# Patient Record
Sex: Female | Born: 1960 | Race: White | Hispanic: No | Marital: Single | Smoking: Current every day smoker
Health system: Southern US, Community
[De-identification: ages and names within clinical notes are randomized; demographics above are authoritative.]

## PROBLEM LIST (undated history)

## (undated) DIAGNOSIS — F419 Anxiety disorder, unspecified: Secondary | ICD-10-CM

## (undated) DIAGNOSIS — K219 Gastro-esophageal reflux disease without esophagitis: Secondary | ICD-10-CM

## (undated) DIAGNOSIS — A159 Respiratory tuberculosis unspecified: Secondary | ICD-10-CM

## (undated) DIAGNOSIS — D649 Anemia, unspecified: Secondary | ICD-10-CM

## (undated) DIAGNOSIS — C539 Malignant neoplasm of cervix uteri, unspecified: Secondary | ICD-10-CM

## (undated) DIAGNOSIS — J449 Chronic obstructive pulmonary disease, unspecified: Secondary | ICD-10-CM

## (undated) DIAGNOSIS — K5090 Crohn's disease, unspecified, without complications: Secondary | ICD-10-CM

## (undated) HISTORY — PX: ABDOMINAL HYSTERECTOMY: SHX81

## (undated) HISTORY — DX: Anxiety disorder, unspecified: F41.9

## (undated) HISTORY — DX: Respiratory tuberculosis unspecified: A15.9

## (undated) HISTORY — PX: COLON SURGERY: SHX602

## (undated) HISTORY — PX: DILATION AND CURETTAGE OF UTERUS: SHX78

---

## 2001-10-24 ENCOUNTER — Encounter: Payer: Self-pay | Admitting: Emergency Medicine

## 2001-10-24 ENCOUNTER — Emergency Department (HOSPITAL_COMMUNITY): Admission: EM | Admit: 2001-10-24 | Discharge: 2001-10-25 | Payer: Self-pay | Admitting: Emergency Medicine

## 2005-02-03 ENCOUNTER — Inpatient Hospital Stay: Payer: Self-pay | Admitting: Anesthesiology

## 2005-02-11 ENCOUNTER — Ambulatory Visit: Payer: Self-pay

## 2005-05-06 ENCOUNTER — Ambulatory Visit: Payer: Self-pay

## 2005-08-19 ENCOUNTER — Ambulatory Visit: Payer: Self-pay

## 2005-08-20 ENCOUNTER — Emergency Department: Payer: Self-pay | Admitting: General Practice

## 2005-08-21 ENCOUNTER — Emergency Department: Payer: Self-pay | Admitting: Internal Medicine

## 2005-08-25 ENCOUNTER — Ambulatory Visit: Payer: Self-pay | Admitting: Unknown Physician Specialty

## 2005-11-25 ENCOUNTER — Ambulatory Visit: Payer: Self-pay | Admitting: Unknown Physician Specialty

## 2006-01-18 ENCOUNTER — Ambulatory Visit: Payer: Self-pay | Admitting: Unknown Physician Specialty

## 2006-05-24 ENCOUNTER — Emergency Department: Payer: Self-pay | Admitting: General Practice

## 2006-05-31 ENCOUNTER — Inpatient Hospital Stay: Payer: Self-pay | Admitting: Internal Medicine

## 2006-06-13 ENCOUNTER — Inpatient Hospital Stay: Payer: Self-pay | Admitting: General Surgery

## 2006-10-13 ENCOUNTER — Ambulatory Visit: Payer: Self-pay | Admitting: Unknown Physician Specialty

## 2007-01-10 ENCOUNTER — Ambulatory Visit: Payer: Self-pay

## 2007-03-13 ENCOUNTER — Ambulatory Visit: Payer: Self-pay | Admitting: Unknown Physician Specialty

## 2007-06-14 ENCOUNTER — Emergency Department: Payer: Self-pay | Admitting: Emergency Medicine

## 2007-08-15 ENCOUNTER — Ambulatory Visit: Payer: Self-pay

## 2007-09-04 ENCOUNTER — Emergency Department: Payer: Self-pay

## 2007-09-05 ENCOUNTER — Ambulatory Visit: Payer: Self-pay | Admitting: Unknown Physician Specialty

## 2008-04-14 ENCOUNTER — Ambulatory Visit: Payer: Self-pay | Admitting: Unknown Physician Specialty

## 2009-04-28 ENCOUNTER — Ambulatory Visit: Payer: Self-pay | Admitting: Family Medicine

## 2009-05-05 ENCOUNTER — Ambulatory Visit: Payer: Self-pay | Admitting: Family Medicine

## 2009-11-15 ENCOUNTER — Emergency Department: Payer: Self-pay | Admitting: Unknown Physician Specialty

## 2010-04-24 ENCOUNTER — Emergency Department: Payer: Self-pay | Admitting: Emergency Medicine

## 2010-05-13 ENCOUNTER — Ambulatory Visit: Payer: Self-pay | Admitting: Family Medicine

## 2010-10-14 ENCOUNTER — Inpatient Hospital Stay: Payer: Self-pay | Admitting: Surgery

## 2011-03-06 ENCOUNTER — Emergency Department: Payer: Self-pay | Admitting: Emergency Medicine

## 2011-05-23 ENCOUNTER — Emergency Department: Payer: Self-pay | Admitting: Emergency Medicine

## 2011-06-28 ENCOUNTER — Emergency Department: Payer: Self-pay | Admitting: Emergency Medicine

## 2011-10-08 ENCOUNTER — Emergency Department: Payer: Self-pay | Admitting: *Deleted

## 2012-01-30 ENCOUNTER — Emergency Department: Payer: Self-pay | Admitting: *Deleted

## 2012-05-01 ENCOUNTER — Emergency Department: Payer: Self-pay | Admitting: Emergency Medicine

## 2012-05-20 ENCOUNTER — Emergency Department: Payer: Self-pay | Admitting: *Deleted

## 2012-05-20 LAB — URINALYSIS, COMPLETE
Bacteria: NONE SEEN
Bilirubin,UR: NEGATIVE
Blood: NEGATIVE
Glucose,UR: NEGATIVE mg/dL (ref 0–75)
Nitrite: NEGATIVE
Ph: 5 (ref 4.5–8.0)
Protein: NEGATIVE
RBC,UR: NONE SEEN /HPF (ref 0–5)
Specific Gravity: 1.026 (ref 1.003–1.030)
Squamous Epithelial: 3
WBC UR: 29 /HPF (ref 0–5)

## 2012-06-26 ENCOUNTER — Ambulatory Visit: Payer: Self-pay | Admitting: Family Medicine

## 2012-08-11 ENCOUNTER — Emergency Department: Payer: Self-pay | Admitting: Emergency Medicine

## 2012-08-12 ENCOUNTER — Emergency Department: Payer: Self-pay | Admitting: Emergency Medicine

## 2012-10-15 ENCOUNTER — Emergency Department: Payer: Self-pay | Admitting: Emergency Medicine

## 2013-01-01 ENCOUNTER — Ambulatory Visit: Payer: Self-pay | Admitting: Internal Medicine

## 2013-04-10 ENCOUNTER — Emergency Department: Payer: Self-pay | Admitting: Emergency Medicine

## 2013-05-09 ENCOUNTER — Ambulatory Visit: Payer: Self-pay | Admitting: Unknown Physician Specialty

## 2013-05-10 LAB — PATHOLOGY REPORT

## 2013-06-08 ENCOUNTER — Ambulatory Visit: Payer: Self-pay

## 2013-08-07 ENCOUNTER — Emergency Department: Payer: Self-pay | Admitting: Emergency Medicine

## 2013-09-04 ENCOUNTER — Ambulatory Visit: Payer: Self-pay | Admitting: Internal Medicine

## 2013-10-28 ENCOUNTER — Emergency Department: Payer: Self-pay | Admitting: Emergency Medicine

## 2013-10-28 LAB — CBC
HCT: 37.9 % (ref 35.0–47.0)
HGB: 13.2 g/dL (ref 12.0–16.0)
MCH: 33 pg (ref 26.0–34.0)
MCHC: 34.8 g/dL (ref 32.0–36.0)
MCV: 95 fL (ref 80–100)
Platelet: 148 10*3/uL — ABNORMAL LOW (ref 150–440)
RBC: 3.99 10*6/uL (ref 3.80–5.20)
RDW: 13 % (ref 11.5–14.5)
WBC: 11.5 10*3/uL — ABNORMAL HIGH (ref 3.6–11.0)

## 2013-10-28 LAB — COMPREHENSIVE METABOLIC PANEL
Albumin: 3.6 g/dL (ref 3.4–5.0)
Alkaline Phosphatase: 127 U/L (ref 50–136)
Anion Gap: 3 — ABNORMAL LOW (ref 7–16)
BUN: 12 mg/dL (ref 7–18)
Bilirubin,Total: 0.3 mg/dL (ref 0.2–1.0)
Calcium, Total: 8.8 mg/dL (ref 8.5–10.1)
Chloride: 106 mmol/L (ref 98–107)
Co2: 28 mmol/L (ref 21–32)
Creatinine: 0.88 mg/dL (ref 0.60–1.30)
EGFR (African American): >60
EGFR (Non-African Amer.): >60
Glucose: 116 mg/dL — ABNORMAL HIGH (ref 65–99)
Osmolality: 275 (ref 275–301)
Potassium: 3.8 mmol/L (ref 3.5–5.1)
SGOT(AST): 21 U/L (ref 15–37)
SGPT (ALT): 21 U/L (ref 12–78)
Sodium: 137 mmol/L (ref 136–145)
Total Protein: 7 g/dL (ref 6.4–8.2)

## 2013-10-28 LAB — LIPASE, BLOOD: Lipase: 516 U/L — ABNORMAL HIGH (ref 73–393)

## 2013-10-28 LAB — TROPONIN I: Troponin-I: <0.02 ng/mL

## 2013-11-01 ENCOUNTER — Ambulatory Visit: Payer: Self-pay | Admitting: Internal Medicine

## 2013-11-29 ENCOUNTER — Ambulatory Visit: Payer: Self-pay | Admitting: Otolaryngology

## 2014-04-17 DIAGNOSIS — K5080 Crohn's disease of both small and large intestine without complications: Secondary | ICD-10-CM | POA: Insufficient documentation

## 2014-09-01 ENCOUNTER — Ambulatory Visit: Payer: Self-pay | Admitting: Internal Medicine

## 2015-07-06 ENCOUNTER — Emergency Department
Admission: EM | Admit: 2015-07-06 | Discharge: 2015-07-06 | Disposition: A | Discharge status: Home / Self Care | Payer: Medicare Other | Attending: Emergency Medicine | Admitting: Emergency Medicine

## 2015-07-06 DIAGNOSIS — M62830 Muscle spasm of back: Secondary | ICD-10-CM | POA: Diagnosis not present

## 2015-07-06 DIAGNOSIS — Z720 Tobacco use: Secondary | ICD-10-CM | POA: Insufficient documentation

## 2015-07-06 DIAGNOSIS — M549 Dorsalgia, unspecified: Secondary | ICD-10-CM | POA: Diagnosis present

## 2015-07-06 HISTORY — DX: Crohn's disease, unspecified, without complications: K50.90

## 2015-07-06 LAB — URINALYSIS COMPLETE WITH MICROSCOPIC (ARMC ONLY)
Bacteria, UA: NONE SEEN
Bilirubin Urine: NEGATIVE
GLUCOSE, UA: NEGATIVE mg/dL
HGB URINE DIPSTICK: NEGATIVE
Ketones, ur: NEGATIVE mg/dL
LEUKOCYTES UA: NEGATIVE
Nitrite: NEGATIVE
PROTEIN: 30 mg/dL — AB
RBC / HPF: NONE SEEN RBC/hpf (ref 0–5)
SPECIFIC GRAVITY, URINE: 1.028 (ref 1.005–1.030)
pH: 6 (ref 5.0–8.0)

## 2015-07-06 LAB — BASIC METABOLIC PANEL
ANION GAP: 8 (ref 5–15)
BUN: 19 mg/dL (ref 6–20)
CALCIUM: 9.4 mg/dL (ref 8.9–10.3)
CHLORIDE: 106 mmol/L (ref 101–111)
CO2: 29 mmol/L (ref 22–32)
Creatinine, Ser: 0.82 mg/dL (ref 0.44–1.00)
GFR calc non Af Amer: >60 mL/min (ref >60)
Glucose, Bld: 101 mg/dL — ABNORMAL HIGH (ref 65–99)
POTASSIUM: 3.7 mmol/L (ref 3.5–5.1)
SODIUM: 143 mmol/L (ref 135–145)

## 2015-07-06 LAB — CBC
HEMATOCRIT: 40.3 % (ref 35.0–47.0)
Hemoglobin: 13.6 g/dL (ref 12.0–16.0)
MCH: 32.7 pg (ref 26.0–34.0)
MCHC: 33.6 g/dL (ref 32.0–36.0)
MCV: 97.2 fL (ref 80.0–100.0)
PLATELETS: 165 10*3/uL (ref 150–440)
RBC: 4.15 MIL/uL (ref 3.80–5.20)
RDW: 13.3 % (ref 11.5–14.5)
WBC: 8.9 10*3/uL (ref 3.6–11.0)

## 2015-07-06 MED ORDER — CYCLOBENZAPRINE HCL 10 MG PO TABS: 10.0000 mg | ORAL_TABLET | Freq: Three times a day (TID) | ORAL | Status: AC | PRN

## 2015-07-06 MED ORDER — TRAMADOL HCL 50 MG PO TABS: 50.0000 mg | ORAL_TABLET | Freq: Four times a day (QID) | ORAL | Status: AC | PRN

## 2015-07-06 NOTE — ED Notes (Signed)
Pt c/o left flank pain for the past 4 days, worse when she lays down at night..denies urinary sx or N/V.Marland Kitchen

## 2015-07-06 NOTE — Discharge Instructions (Signed)

## 2015-07-06 NOTE — ED Provider Notes (Signed)
[facility] Emergency Department Provider Note  ____________________________________________  Time seen: On arrival  I have reviewed the triage vital signs and the nursing notes.   HISTORY  Chief Complaint Flank Pain    HPI Rebecca Escobar is a 54 y.o. female who complains of mild left back pain for the last 2-3 days. It seems to be worse when she lies down up as pressure-like. It also hurts when she twists her back or flexes forward. She is concerned it may be related to her kidney. No fevers no chills. No IV drug abuse. No focal deficits. No rash, no tingling.    Past Medical History  Diagnosis Date  . Crohn's disease     There are no active problems to display for this patient.   Past Surgical History  Procedure Laterality Date  . Cesarean section    . Colon surgery    . Abdominal hysterectomy      Current Outpatient Rx  Name  Route  Sig  Dispense  Refill  . cyclobenzaprine (FLEXERIL) 10 MG tablet   Oral   Take 1 tablet (10 mg total) by mouth every 8 (eight) hours as needed for muscle spasms.   30 tablet   1   . traMADol (ULTRAM) 50 MG tablet   Oral   Take 1 tablet (50 mg total) by mouth every 6 (six) hours as needed.   20 tablet   0     Allergies Review of patient's allergies indicates no known allergies.  No family history on file.  Social History History  Substance Use Topics  . Smoking status: Current Every Day Smoker -- 0.50 packs/day    Types: Cigarettes  . Smokeless tobacco: Never Used  . Alcohol Use: No    Review of Systems  Constitutional: Negative for fever. Eyes: Negative for visual changes. ENT: Negative for sore throat   Genitourinary: Negative for dysuria. Musculoskeletal: Negative for back pain. Skin: Negative for rash. Neurological: Negative for headaches or focal weakness   ____________________________________________   PHYSICAL EXAM:  VITAL SIGNS: ED Triage Vitals  Enc Vitals Group      BP 07/06/15 1538 108/67 mmHg     Pulse Rate 07/06/15 1538 58     Resp 07/06/15 1538 16     Temp --      Temp Source 07/06/15 1538 Oral     SpO2 07/06/15 1538 100 %     Weight 07/06/15 1538 170 lb (77.111 kg)     Height 07/06/15 1538 5' 6"  (1.676 m)     Head Cir --      Peak Flow --      Pain Score 07/06/15 1538 8     Pain Loc --      Pain Edu? --      Excl. in [place]? --      Constitutional: Alert and oriented. Well appearing and in no distress. Eyes: Conjunctivae are normal.  ENT   Head: Normocephalic and atraumatic.   Mouth/Throat: Mucous membranes are moist. Cardiovascular: Normal rate, regular rhythm.  Respiratory: Normal respiratory effort without tachypnea nor retractions.  Gastrointestinal: Soft and non-tender in all quadrants. No distention. There is no CVA tenderness. Musculoskeletal: Nontender with normal range of motion in all extremities. Patient has muscle spasm to left lower paraspinal muscle. She is tender only in that spot Neurologic:  Normal speech and language. No gross focal neurologic deficits are appreciated. Normal lower extremity strength Skin:  Skin is warm, dry and intact. No rash  noted. Psychiatric: Mood and affect are normal. Patient exhibits appropriate insight and judgment.  ____________________________________________    LABS (pertinent positives/negatives)  Labs Reviewed  BASIC METABOLIC PANEL - Abnormal; Notable for the following:    Glucose, Bld 101 (*)    All other components within normal limits  URINALYSIS COMPLETEWITH MICROSCOPIC ([facility] ONLY) - Abnormal; Notable for the following:    Color, Urine YELLOW (*)    APPearance CLEAR (*)    Protein, ur 30 (*)    Squamous Epithelial / LPF 0-5 (*)    All other components within normal limits  CBC    ____________________________________________     ____________________________________________    RADIOLOGY I have personally reviewed any xrays that were ordered on this  patient:   ____________________________________________   PROCEDURES  Procedure(s) performed: none   ____________________________________________   INITIAL IMPRESSION / ASSESSMENT AND PLAN / ED COURSE  Pertinent labs & imaging results that were available during my care of the patient were reviewed by me and considered in my medical decision making (see chart for details). Benign exam most consistent with muscle spasm. We will treat conservatively. Follow-up with PCP if no improvement in 2 days  ____________________________________________   FINAL CLINICAL IMPRESSION(S) / ED DIAGNOSES  Final diagnoses:  Muscle spasm of back     Lavonia Drafts, MD 07/06/15 1918

## 2016-03-01 ENCOUNTER — Encounter: Payer: Self-pay | Admitting: *Deleted

## 2016-03-02 ENCOUNTER — Encounter
Admission: RE | Disposition: A | Discharge status: Home / Self Care | Payer: Self-pay | Source: Ambulatory Visit | Attending: Unknown Physician Specialty

## 2016-03-02 ENCOUNTER — Ambulatory Visit: Payer: Medicare Other | Admitting: Anesthesiology

## 2016-03-02 ENCOUNTER — Ambulatory Visit
Admission: RE | Admit: 2016-03-02 | Discharge: 2016-03-02 | Disposition: A | Discharge status: Home / Self Care | Payer: Medicare Other | Source: Ambulatory Visit | Attending: Unknown Physician Specialty | Admitting: Unknown Physician Specialty

## 2016-03-02 ENCOUNTER — Encounter: Payer: Self-pay | Admitting: Anesthesiology

## 2016-03-02 DIAGNOSIS — K219 Gastro-esophageal reflux disease without esophagitis: Secondary | ICD-10-CM | POA: Diagnosis not present

## 2016-03-02 DIAGNOSIS — D125 Benign neoplasm of sigmoid colon: Secondary | ICD-10-CM | POA: Diagnosis not present

## 2016-03-02 DIAGNOSIS — Z79899 Other long term (current) drug therapy: Secondary | ICD-10-CM | POA: Insufficient documentation

## 2016-03-02 DIAGNOSIS — F17210 Nicotine dependence, cigarettes, uncomplicated: Secondary | ICD-10-CM | POA: Insufficient documentation

## 2016-03-02 DIAGNOSIS — K640 First degree hemorrhoids: Secondary | ICD-10-CM | POA: Diagnosis not present

## 2016-03-02 DIAGNOSIS — K5090 Crohn's disease, unspecified, without complications: Secondary | ICD-10-CM | POA: Insufficient documentation

## 2016-03-02 DIAGNOSIS — Z8541 Personal history of malignant neoplasm of cervix uteri: Secondary | ICD-10-CM | POA: Diagnosis not present

## 2016-03-02 HISTORY — PX: COLONOSCOPY WITH PROPOFOL: SHX5780

## 2016-03-02 HISTORY — DX: Gastro-esophageal reflux disease without esophagitis: K21.9

## 2016-03-02 HISTORY — DX: Anemia, unspecified: D64.9

## 2016-03-02 HISTORY — DX: Malignant neoplasm of cervix uteri, unspecified: C53.9

## 2016-03-02 SURGERY — COLONOSCOPY WITH PROPOFOL
Anesthesia: General

## 2016-03-02 MED ORDER — PROPOFOL 500 MG/50ML IV EMUL
INTRAVENOUS | Status: DC | PRN
  Administered 2016-03-02: 120 ug/kg/min via INTRAVENOUS

## 2016-03-02 MED ORDER — SODIUM CHLORIDE 0.9 % IV SOLN
INTRAVENOUS | Status: DC
  Administered 2016-03-02: 1000 mL via INTRAVENOUS

## 2016-03-02 MED ORDER — PROPOFOL 10 MG/ML IV BOLUS
INTRAVENOUS | Status: DC | PRN
  Administered 2016-03-02: 80 mg via INTRAVENOUS

## 2016-03-02 MED ORDER — MIDAZOLAM HCL 2 MG/2ML IJ SOLN
INTRAMUSCULAR | Status: DC | PRN
  Administered 2016-03-02: 1 mg via INTRAVENOUS

## 2016-03-02 MED ORDER — SODIUM CHLORIDE 0.9 % IV SOLN: INTRAVENOUS | Status: DC

## 2016-03-02 NOTE — Op Note (Signed)
[facility]astroenterology Patient Name: Rebecca Escobar Procedure Date: 03/02/2016 10:16 AM MRN: [id number] Account #: [id number] Date of Birth: 07-13-1961 Admit Type: Outpatient Age: 56 Room: [facility] ENDO ROOM 4 Gender: Female Note Status: Finalized Procedure:            Colonoscopy Indications:          High risk colon cancer surveillance: Crohn's small and                        large intestine Providers:            Manya Silvas, MD Referring MD:         Ellin Goodie, MD (Referring MD) Medicines:            Propofol per Anesthesia Complications:        No immediate complications. Procedure:            Pre-Anesthesia Assessment:                       - After reviewing the risks and benefits, the patient                        was deemed in satisfactory condition to undergo the                        procedure.                       After obtaining informed consent, the colonoscope was                        passed under direct vision. Throughout the procedure,                        the patient's blood pressure, pulse, and oxygen                        saturations were monitored continuously. The                        Colonoscope was introduced through the anus and                        advanced to the the cecum, identified by appendiceal                        orifice and ileocecal valve. The colonoscopy was                        performed without difficulty. The patient tolerated the                        procedure well. The quality of the bowel preparation                        was good. Findings:      Multiple sessile polyps were found in the sigmoid colon. The polyps were       small in size. These polyps were removed with a hot snare. Resection and       retrieval were complete.      Internal hemorrhoids were found  during endoscopy. The hemorrhoids were       medium-sized and Grade I (internal hemorrhoids that do not prolapse).  Anastamotic junction was very narrowed and scope would not even come       close to passing into the small bowel. Impression:           - Multiple small polyps in the sigmoid colon, removed                        with a hot snare. Resected and retrieved.                       - Internal hemorrhoids. Recommendation:       - Await pathology results. Manya Silvas, MD 03/02/2016 10:54:44 AM This report has been signed electronically. Number of Addenda: 0 Note Initiated On: 03/02/2016 10:16 AM Scope Withdrawal Time: 0 hours 21 minutes 26 seconds  Total Procedure Duration: 0 hours 28 minutes 15 seconds       [facility]

## 2016-03-02 NOTE — H&P (Signed)
   Primary Care Physician:  Ellamae Sia, MD Primary Gastroenterologist:  Dr. Vira Agar  Pre-Procedure History & Physical: HPI:  Rebecca Escobar is a 55 y.o. female is here for an colonoscopy.   Past Medical History  Diagnosis Date  . Crohn's disease ([place])   . Anemia   . GERD (gastroesophageal reflux disease)   . Cervical cancer [facility])     Past Surgical History  Procedure Laterality Date  . Cesarean section    . Abdominal hysterectomy    . Colon surgery      Prior to Admission medications   Medication Sig Start Date End Date Taking? Authorizing Provider  Certolizumab Pegol 2 X 200 MG/ML KIT Inject 400 mg into the skin every 30 (thirty) days.   Yes Historical Provider, MD  cyanocobalamin 1000 MCG tablet Inject 1,000 mcg as directed every 30 (thirty) days.   Yes Historical Provider, MD  meclizine (ANTIVERT) 12.5 MG tablet Take 12.5 mg by mouth 3 (three) times daily as needed for dizziness.   Yes Historical Provider, MD  Multiple Vitamin (MULTIVITAMIN) tablet Take 1 tablet by mouth daily.   Yes Historical Provider, MD  omeprazole (PRILOSEC) 20 MG capsule Take 20 mg by mouth daily.   Yes Historical Provider, MD  cyclobenzaprine (FLEXERIL) 10 MG tablet Take 1 tablet (10 mg total) by mouth every 8 (eight) hours as needed for muscle spasms. 07/06/15 07/05/16  Lavonia Drafts, MD  traMADol (ULTRAM) 50 MG tablet Take 1 tablet (50 mg total) by mouth every 6 (six) hours as needed. 07/06/15 07/05/16  Lavonia Drafts, MD    Allergies as of 02/18/2016  . (No Known Allergies)    History reviewed. No pertinent family history.  Social History   Social History  . Marital Status: Divorced    Spouse Name: N/A  . Number of Children: N/A  . Years of Education: N/A   Occupational History  . Not on file.   Social History Main Topics  . Smoking status: Current Every Day Smoker -- 0.50 packs/day    Types: Cigarettes  . Smokeless tobacco: Never Used  . Alcohol Use: No  . Drug Use: No  .  Sexual Activity: Yes   Other Topics Concern  . Not on file   Social History Narrative    Review of Systems: See HPI, otherwise negative ROS  Physical Exam: BP 130/64 mmHg  Pulse 63  Temp(Src) 96.5 F (35.8 C) (Tympanic)  Resp 18  Ht _0  (1.702 m)  Wt 80.287 kg (177 lb)  BMI 27.72 kg/m2  SpO2 100% General:   Alert,  pleasant and cooperative in NAD Head:  Normocephalic and atraumatic. Neck:  Supple; no masses or thyromegaly. Lungs:  Clear throughout to auscultation.    Heart:  Regular rate and rhythm. Abdomen:  Soft, nontender and nondistended. Normal bowel sounds, without guarding, and without rebound.   Neurologic:  Alert and  oriented x4;  grossly normal neurologically.  Impression/Plan: Rebecca Escobar is here for an colonoscopy to be performed for follow up Crohn's disease.  Risks, benefits, limitations, and alternatives regarding  colonoscopy have been reviewed with the patient.  Questions have been answered.  All parties agreeable.   Gaylyn Cheers, MD  03/02/2016, 10:08 AM

## 2016-03-02 NOTE — Anesthesia Preprocedure Evaluation (Addendum)
Anesthesia Evaluation  Patient identified by MRN, date of birth, ID band Patient awake    Reviewed: Allergy & Precautions, NPO status , Patient's Chart, lab work & pertinent test results  Airway Mallampati: II  TM Distance: >3 FB     Dental  (+) Partial Lower, Chipped   Pulmonary Current Smoker,   hacky cough at times secondary to smoking Pulmonary exam normal        Cardiovascular negative cardio ROS Normal cardiovascular exam     Neuro/Psych negative neurological ROS  negative psych ROS   GI/Hepatic Neg liver ROS, GERD  Medicated and Controlled,crohns Dz   Endo/Other  negative endocrine ROS  Renal/GU negative Renal ROS   Cervical CA 2001    Musculoskeletal negative musculoskeletal ROS (+)   Abdominal Normal abdominal exam  (+)   Peds negative pediatric ROS (+)  Hematology  (+) anemia ,   Anesthesia Other Findings   Reproductive/Obstetrics                            Anesthesia Physical Anesthesia Plan  ASA: II  Anesthesia Plan: General   Post-op Pain Management:    Induction: Intravenous  Airway Management Planned: Nasal Cannula  Additional Equipment:   Intra-op Plan:   Post-operative Plan:   Informed Consent: I have reviewed the patients History and Physical, chart, labs and discussed the procedure including the risks, benefits and alternatives for the proposed anesthesia with the patient or authorized representative who has indicated his/her understanding and acceptance.   Dental advisory given  Plan Discussed with: CRNA and Surgeon  Anesthesia Plan Comments:         Anesthesia Quick Evaluation

## 2016-03-02 NOTE — Anesthesia Postprocedure Evaluation (Signed)
Anesthesia Post Note  Patient: Rebecca Escobar  Procedure(s) Performed: Procedure(s) (LRB): COLONOSCOPY WITH PROPOFOL (N/A)  Patient location during evaluation: PACU Anesthesia Type: General Level of consciousness: awake and alert and oriented Pain management: pain level controlled Vital Signs Assessment: post-procedure vital signs reviewed and stable Respiratory status: spontaneous breathing Cardiovascular status: blood pressure returned to baseline Anesthetic complications: no    Last Vitals:  Filed Vitals:   03/02/16 1125 03/02/16 1135  BP: 119/74 132/76  Pulse: 60 65  Temp:    Resp: 18 21    Last Pain: There were no vitals filed for this visit.               Rayne Loiseau

## 2016-03-02 NOTE — Transfer of Care (Signed)
Immediate Anesthesia Transfer of Care Note  Patient: Rebecca Escobar  Procedure(s) Performed: Procedure(s): COLONOSCOPY WITH PROPOFOL (N/A)  Patient Location: PACU and Endoscopy Unit  Anesthesia Type:General  Level of Consciousness: patient cooperative and lethargic  Airway & Oxygen Therapy: Patient Spontanous Breathing and Patient connected to nasal cannula oxygen  Post-op Assessment: Report given to RN and Post -op Vital signs reviewed and stable  Post vital signs: Reviewed and stable  Last Vitals:  Filed Vitals:   03/02/16 0956 03/02/16 1057  BP: 130/64 99/66  Pulse: 63   Temp: 35.8 C 36 C  Resp: 18 22    Complications: No apparent anesthesia complications

## 2016-03-03 LAB — SURGICAL PATHOLOGY

## 2016-04-29 ENCOUNTER — Emergency Department
Admission: EM | Admit: 2016-04-29 | Discharge: 2016-04-29 | Disposition: A | Discharge status: Home / Self Care | Payer: Medicare Other | Attending: Emergency Medicine | Admitting: Emergency Medicine

## 2016-04-29 ENCOUNTER — Encounter: Payer: Self-pay | Admitting: Medical Oncology

## 2016-04-29 ENCOUNTER — Emergency Department: Payer: Medicare Other

## 2016-04-29 DIAGNOSIS — J029 Acute pharyngitis, unspecified: Secondary | ICD-10-CM | POA: Diagnosis present

## 2016-04-29 DIAGNOSIS — F17210 Nicotine dependence, cigarettes, uncomplicated: Secondary | ICD-10-CM | POA: Diagnosis not present

## 2016-04-29 DIAGNOSIS — J36 Peritonsillar abscess: Secondary | ICD-10-CM | POA: Insufficient documentation

## 2016-04-29 DIAGNOSIS — Z8541 Personal history of malignant neoplasm of cervix uteri: Secondary | ICD-10-CM | POA: Diagnosis not present

## 2016-04-29 LAB — COMPREHENSIVE METABOLIC PANEL
ALBUMIN: 3.8 g/dL (ref 3.5–5.0)
ALK PHOS: 102 U/L (ref 38–126)
ALT: 9 U/L — AB (ref 14–54)
AST: 16 U/L (ref 15–41)
Anion gap: 8 (ref 5–15)
BUN: 12 mg/dL (ref 6–20)
CALCIUM: 9.1 mg/dL (ref 8.9–10.3)
CO2: 27 mmol/L (ref 22–32)
CREATININE: 0.7 mg/dL (ref 0.44–1.00)
Chloride: 105 mmol/L (ref 101–111)
GFR calc Af Amer: >60 mL/min (ref >60)
GFR calc non Af Amer: >60 mL/min (ref >60)
GLUCOSE: 96 mg/dL (ref 65–99)
Potassium: 4.2 mmol/L (ref 3.5–5.1)
SODIUM: 140 mmol/L (ref 135–145)
Total Bilirubin: 0.6 mg/dL (ref 0.3–1.2)
Total Protein: 7.5 g/dL (ref 6.5–8.1)

## 2016-04-29 LAB — CBC WITH DIFFERENTIAL/PLATELET
Basophils Absolute: 0.1 10*3/uL (ref 0–0.1)
EOS ABS: 0 10*3/uL (ref 0–0.7)
Eosinophils Relative: 0 %
HCT: 38 % (ref 35.0–47.0)
HEMOGLOBIN: 13 g/dL (ref 12.0–16.0)
LYMPHS ABS: 1.7 10*3/uL (ref 1.0–3.6)
Lymphocytes Relative: 13 %
MCH: 32.7 pg (ref 26.0–34.0)
MCHC: 34.3 g/dL (ref 32.0–36.0)
MCV: 95.3 fL (ref 80.0–100.0)
Monocytes Absolute: 0.6 10*3/uL (ref 0.2–0.9)
Monocytes Relative: 4 %
NEUTROS ABS: 10.7 10*3/uL — AB (ref 1.4–6.5)
Platelets: 197 10*3/uL (ref 150–440)
RBC: 3.99 MIL/uL (ref 3.80–5.20)
RDW: 13.2 % (ref 11.5–14.5)
WBC: 13.1 10*3/uL — AB (ref 3.6–11.0)

## 2016-04-29 MED ORDER — IOPAMIDOL (ISOVUE-300) INJECTION 61%
75.0000 mL | Freq: Once | INTRAVENOUS | Status: AC | PRN
Start: 2016-04-29 — End: 2016-04-29
  Administered 2016-04-29: 75 mL via INTRAVENOUS
  Filled 2016-04-29: qty 75

## 2016-04-29 MED ORDER — DEXAMETHASONE SODIUM PHOSPHATE 10 MG/ML IJ SOLN
10.0000 mg | Freq: Once | INTRAMUSCULAR | Status: AC
  Administered 2016-04-29: 10 mg via INTRAVENOUS
  Filled 2016-04-29: qty 1

## 2016-04-29 MED ORDER — MORPHINE SULFATE (PF) 4 MG/ML IV SOLN
4.0000 mg | Freq: Once | INTRAVENOUS | Status: AC
  Administered 2016-04-29: 4 mg via INTRAVENOUS
  Filled 2016-04-29: qty 1

## 2016-04-29 MED ORDER — ONDANSETRON HCL 4 MG/2ML IJ SOLN
4.0000 mg | Freq: Once | INTRAMUSCULAR | Status: AC
  Administered 2016-04-29: 4 mg via INTRAVENOUS
  Filled 2016-04-29: qty 2

## 2016-04-29 MED ORDER — HYDROCODONE-ACETAMINOPHEN 5-325 MG PO TABS: 1.0000 | ORAL_TABLET | ORAL | Status: DC | PRN

## 2016-04-29 MED ORDER — HYDROMORPHONE HCL 1 MG/ML IJ SOLN
0.5000 mg | Freq: Once | INTRAMUSCULAR | Status: AC
  Administered 2016-04-29: 0.5 mg via INTRAVENOUS
  Filled 2016-04-29: qty 1

## 2016-04-29 MED ORDER — PREDNISONE 10 MG (21) PO TBPK: ORAL_TABLET | ORAL | Status: DC

## 2016-04-29 MED ORDER — SODIUM CHLORIDE 0.9 % IV SOLN
3.0000 g | Freq: Once | INTRAVENOUS | Status: AC
  Administered 2016-04-29: 3 g via INTRAVENOUS
  Filled 2016-04-29: qty 3

## 2016-04-29 MED ORDER — AMOXICILLIN-POT CLAVULANATE 875-125 MG PO TABS: 1.0000 | ORAL_TABLET | Freq: Two times a day (BID) | ORAL | Status: DC

## 2016-04-29 NOTE — ED Notes (Signed)
CT notified that scan had not yet been completed.  CT unsure when they will come get patient to do scan.

## 2016-04-29 NOTE — ED Notes (Signed)
Patient states today is day 10 of sxs of throat pain and right ear and right head pain.  Patient states she has been seen by her PCP and did a strep test and culture which where negative and looked in her ears and have not been able to find anything wrong.  Patient c/o difficulty sleeping due to sxs and painful when swallowing. Pt states she did take a Tramadol today that is prescribed to her.

## 2016-04-29 NOTE — ED Notes (Signed)
Pt reports sore throat, rt ear ache and rt sided headache x 10 days.

## 2016-04-29 NOTE — Discharge Instructions (Signed)
Peritonsillar Abscess A peritonsillar abscess is a collection of yellowish-white fluid (pus) in the back of the throat behind the tonsils. It usually occurs when an infection of the throat or tonsils (tonsillitis) spreads into the tissues around the tonsils. CAUSES The infection that leads to a peritonsillar abscess is usually caused by streptococcal bacteria.  SIGNS AND SYMPTOMS  Sore throat, often with pain on just one side.  Swelling and tenderness of the glands (lymph nodes) in the neck.  Difficulty swallowing.  Difficulty opening your mouth.  Fever.  Chills.  Drooling because of difficulty swallowing saliva.  Headache.  Changes in your voice.  Bad breath. DIAGNOSIS Your health care provider will take your medical history and do a physical exam. Imaging tests may be done, such as an ultrasound or CT scan. A sample of pus may be removed from the abscess using a needle (needle aspiration) or by swabbing the back of your throat. This sample will be sent to a lab for testing. TREATMENT Treatment usually involves draining the pus from the abscess. This may be done through needle aspiration or by making an incision in the abscess. You will also likely need to take antibiotic medicine. HOME CARE INSTRUCTIONS  Rest as much as possible and get plenty of sleep.  Take medicines only as directed by your health care provider.  If you were prescribed an antibiotic medicine, finish it all even if you start to feel better.  If your abscess was drained by your health care provider, gargle with a mixture of salt and warm water:  Mix 1 tsp of salt in 8 oz of warm water.  Gargle with this mixture four times per day or as needed for comfort.  Do not swallow this mixture.  Drink plenty of fluids.  While your throat is sore, eat soft or liquid foods, such as frozen ice pops and ice cream.  Keep all follow-up visits as directed by your health care provider. This is important. SEEK  MEDICAL CARE IF:  You have increased pain, swelling, redness, or drainage in your throat.  You develop a headache, a lack of energy (lethargy), or generalized feelings of illness.  You have a fever.  You feel dizzy.  You have difficulty swallowing or eating.  You show signs of becoming dehydrated, such as:  Light-headedness when standing.  Decreased urine output.  A fast heart rate.  Dry mouth. SEEK IMMEDIATE MEDICAL CARE IF:   You have difficulty talking or breathing, or you find it easier to breathe when you lean forward.  You are coughing up blood or vomiting blood.  You have severe throat pain that is not helped by medicines.  You start to drool.   This information is not intended to replace advice given to you by your health care provider. Make sure you discuss any questions you have with your health care provider.   Document Released: 12/05/2005 Document Revised: 12/26/2014 Document Reviewed: 07/21/2014 Elsevier Interactive Patient Education [employer].

## 2016-04-29 NOTE — ED Notes (Signed)
Patient transported to CT 

## 2016-04-29 NOTE — ED Provider Notes (Signed)
[facility] Emergency Department Provider Note  ____________________________________________  Time seen: Approximately 1:17 PM  I have reviewed the triage vital signs and the nursing notes.   HISTORY  Chief Complaint Sore Throat; Otalgia; and Headache   HPI Rebecca Escobar is a 55 y.o. female who presents to the emergency department for evaluation of sore throat and right ear pain x 7 days. She has been evaluated by her PCP twice, but has tested negative for strep and an ear infection. She has not been on any antibiotics. She states that the right side of her throat and her right ear are very painful and getting worse each day. Swallowing is painful and becoming more difficulty. Pain is preventing sleep. Some relief with tramadol today, otherwise she has just been using the nasal spray her PCP prescribed.    Past Medical History  Diagnosis Date  . Crohn's disease ([place])   . Anemia   . GERD (gastroesophageal reflux disease)   . Cervical cancer ([place])     There are no active problems to display for this patient.   Past Surgical History  Procedure Laterality Date  . Cesarean section    . Abdominal hysterectomy    . Colon surgery    . Colonoscopy with propofol N/A 03/02/2016    Procedure: COLONOSCOPY WITH PROPOFOL;  Surgeon: Manya Silvas, MD;  Location: [facility] ENDOSCOPY;  Service: Endoscopy;  Laterality: N/A;    Current Outpatient Rx  Name  Route  Sig  Dispense  Refill  . amoxicillin-clavulanate (AUGMENTIN) 875-125 MG tablet   Oral   Take 1 tablet by mouth 2 (two) times daily.   20 tablet   0   . Certolizumab Pegol 2 X 200 MG/ML KIT   Subcutaneous   Inject 400 mg into the skin every 30 (thirty) days.         . cyanocobalamin 1000 MCG tablet   Injection   Inject 1,000 mcg as directed every 30 (thirty) days.         . cyclobenzaprine (FLEXERIL) 10 MG tablet   Oral   Take 1 tablet (10 mg total) by mouth every 8 (eight) hours as needed for  muscle spasms.   30 tablet   1   . HYDROcodone-acetaminophen (NORCO/VICODIN) 5-325 MG tablet   Oral   Take 1 tablet by mouth every 4 (four) hours as needed.   12 tablet   0   . meclizine (ANTIVERT) 12.5 MG tablet   Oral   Take 12.5 mg by mouth 3 (three) times daily as needed for dizziness.         . Multiple Vitamin (MULTIVITAMIN) tablet   Oral   Take 1 tablet by mouth daily.         Marland Kitchen omeprazole (PRILOSEC) 20 MG capsule   Oral   Take 20 mg by mouth daily.         . predniSONE (STERAPRED UNI-PAK 21 TAB) 10 MG (21) TBPK tablet      Take 6 tablets on day 1 Take 5 tablets on day 2 Take 4 tablets on day 3 Take 3 tablets on day 4 Take 2 tablets on day 5 Take 1 tablet on day 6   21 tablet   0   . traMADol (ULTRAM) 50 MG tablet   Oral   Take 1 tablet (50 mg total) by mouth every 6 (six) hours as needed.   20 tablet   0     Allergies Nsaids  No family  history on file.  Social History Social History  Substance Use Topics  . Smoking status: Current Every Day Smoker -- 0.50 packs/day    Types: Cigarettes  . Smokeless tobacco: Never Used  . Alcohol Use: No    Review of Systems Constitutional: Negative for fever. Eyes: No visual changes. ENT: Positive for sore throat; positive for difficulty swallowing. Respiratory: Denies shortness of breath. Gastrointestinal: No abdominal pain.  No nausea, no vomiting.  No diarrhea. Musculoskeletal: Negative for generalized body aches. Skin: Negative for rash. Neurological: Negative for headaches, focal weakness or numbness.  ____________________________________________   PHYSICAL EXAM:  VITAL SIGNS: ED Triage Vitals  Enc Vitals Group     BP 04/29/16 1236 143/82 mmHg     Pulse Rate 04/29/16 1236 80     Resp 04/29/16 1236 20     Temp 04/29/16 1236 97.6 F (36.4 C)     Temp Source 04/29/16 1236 Oral     SpO2 04/29/16 1236 97 %     Weight 04/29/16 1236 172 lb (78.019 kg)     Height 04/29/16 1236 5' 6"  (1.676 m)      Head Cir --      Peak Flow --      Pain Score 04/29/16 1237 10     Pain Loc --      Pain Edu? --      Excl. in [place]? --     Constitutional: Alert and oriented. Well appearing and in no acute distress. Eyes: Conjunctivae are normal. PERRL. EOMI. Head: Atraumatic. Nose: No congestion/rhinnorhea. Mouth/Throat: Mucous membranes are moist.  Oropharynx erythematous, with right side tonsil edema and swelling. Neck: No stridor. Managing secretions. Lymphatic: Lymphadenopathy: right anterior cervical palpable and tender Cardiovascular: Normal rate, regular rhythm. Good peripheral circulation. Respiratory: Normal respiratory effort. Lungs CTAB. Gastrointestinal: Soft and nontender. Musculoskeletal: No lower extremity tenderness nor edema.   Neurologic:  Normal speech and language. No gross focal neurologic deficits are appreciated. Speech is normal. No gait instability. Skin:  Skin is warm, dry and intact. No rash noted Psychiatric: Mood and affect are normal. Speech and behavior are normal.  ____________________________________________   LABS (all labs ordered are listed, but only abnormal results are displayed)  Labs Reviewed  CBC WITH DIFFERENTIAL/PLATELET - Abnormal; Notable for the following:    WBC 13.1 (*)    Neutro Abs 10.7 (*)    All other components within normal limits  COMPREHENSIVE METABOLIC PANEL - Abnormal; Notable for the following:    ALT 9 (*)    All other components within normal limits  CULTURE, BLOOD (ROUTINE X 2)  CULTURE, BLOOD (ROUTINE X 2)   ____________________________________________  EKG   ____________________________________________  RADIOLOGY  1.5 cm tonsillar/peritonsillar abscess on the right per radiology. ____________________________________________   PROCEDURES  Procedure(s) performed: None  Critical Care performed: No  ____________________________________________   INITIAL IMPRESSION / ASSESSMENT AND PLAN / ED  COURSE  Pertinent labs & imaging results that were available during my care of the patient were reviewed by me and considered in my medical decision making (see chart for details).  After speaking with Dr. Richardson Landry, patient will be discharged home on Augmentin and prednisone. She will also receive Norco for pain control. Strict return precautions were given and patient verbalized understanding. ____________________________________________   FINAL CLINICAL IMPRESSION(S) / ED DIAGNOSES  Final diagnoses:  Peritonsillar abscess      Victorino Dike, FNP 04/29/16 [place], MD 05/01/16 [phone]

## 2016-05-05 LAB — CULTURE, BLOOD (ROUTINE X 2)
CULTURE: NO GROWTH
CULTURE: NO GROWTH

## 2016-07-19 ENCOUNTER — Ambulatory Visit: Payer: Self-pay | Admitting: Urology

## 2016-07-31 ENCOUNTER — Emergency Department
Admission: EM | Admit: 2016-07-31 | Discharge: 2016-07-31 | Disposition: A | Discharge status: Home / Self Care | Payer: Medicare Other | Attending: Emergency Medicine | Admitting: Emergency Medicine

## 2016-07-31 ENCOUNTER — Encounter: Payer: Self-pay | Admitting: Intensive Care

## 2016-07-31 DIAGNOSIS — F17210 Nicotine dependence, cigarettes, uncomplicated: Secondary | ICD-10-CM | POA: Diagnosis not present

## 2016-07-31 DIAGNOSIS — Z8541 Personal history of malignant neoplasm of cervix uteri: Secondary | ICD-10-CM | POA: Diagnosis not present

## 2016-07-31 DIAGNOSIS — R319 Hematuria, unspecified: Secondary | ICD-10-CM | POA: Diagnosis not present

## 2016-07-31 LAB — URINALYSIS COMPLETE WITH MICROSCOPIC (ARMC ONLY)
Bilirubin Urine: NEGATIVE
Glucose, UA: NEGATIVE mg/dL
Ketones, ur: NEGATIVE mg/dL
Nitrite: NEGATIVE
PROTEIN: 100 mg/dL — AB
SPECIFIC GRAVITY, URINE: 1.017 (ref 1.005–1.030)
pH: 5 (ref 5.0–8.0)

## 2016-07-31 MED ORDER — NITROFURANTOIN MONOHYD MACRO 100 MG PO CAPS: 100.0000 mg | ORAL_CAPSULE | Freq: Two times a day (BID) | ORAL | 0 refills | Status: AC

## 2016-07-31 NOTE — ED Provider Notes (Signed)
[facility] Emergency Department Provider Note   ____________________________________________    I have reviewed the triage vital signs and the nursing notes.   HISTORY  Chief Complaint Hematuria     HPI ABBIEGAIL Escobar is a 55 y.o. female Who presents with complaints of hematuria. She also reports dysuria. She notes this is been going on for approximately 3 weeks. She has follow-up with urology in 1 week. Her gastroenterologist prescribed her Cipro without improvement. She denies abdominal pain. No fevers or chills. No nausea or vomiting. No back pain.   Past Medical History:  Diagnosis Date  . Anemia   . Cervical cancer ([place])   . Crohn's disease ([place])   . GERD (gastroesophageal reflux disease)     There are no active problems to display for this patient.   Past Surgical History:  Procedure Laterality Date  . ABDOMINAL HYSTERECTOMY    . CESAREAN SECTION    . COLON SURGERY    . COLONOSCOPY WITH PROPOFOL N/A 03/02/2016   Procedure: COLONOSCOPY WITH PROPOFOL;  Surgeon: Manya Silvas, MD;  Location: [facility] ENDOSCOPY;  Service: Endoscopy;  Laterality: N/A;    Prior to Admission medications   Medication Sig Start Date End Date Taking? Authorizing Provider  amoxicillin-clavulanate (AUGMENTIN) 875-125 MG tablet Take 1 tablet by mouth 2 (two) times daily. 04/29/16   Victorino Dike, FNP  Certolizumab Pegol 2 X 200 MG/ML KIT Inject 400 mg into the skin every 30 (thirty) days.    Historical Provider, MD  cyanocobalamin 1000 MCG tablet Inject 1,000 mcg as directed every 30 (thirty) days.    Historical Provider, MD  HYDROcodone-acetaminophen (NORCO/VICODIN) 5-325 MG tablet Take 1 tablet by mouth every 4 (four) hours as needed. 04/29/16   Victorino Dike, FNP  meclizine (ANTIVERT) 12.5 MG tablet Take 12.5 mg by mouth 3 (three) times daily as needed for dizziness.    Historical Provider, MD  Multiple Vitamin (MULTIVITAMIN) tablet Take 1 tablet by mouth  daily.    Historical Provider, MD  nitrofurantoin, macrocrystal-monohydrate, (MACROBID) 100 MG capsule Take 1 capsule (100 mg total) by mouth 2 (two) times daily. 07/31/16 08/07/16  Lavonia Drafts, MD  omeprazole (PRILOSEC) 20 MG capsule Take 20 mg by mouth daily.    Historical Provider, MD  predniSONE (STERAPRED UNI-PAK 21 TAB) 10 MG (21) TBPK tablet Take 6 tablets on day 1 Take 5 tablets on day 2 Take 4 tablets on day 3 Take 3 tablets on day 4 Take 2 tablets on day 5 Take 1 tablet on day 6 04/29/16   Victorino Dike, FNP     Allergies Nsaids  History reviewed. No pertinent family history.  Social History Social History  Substance Use Topics  . Smoking status: Current Every Day Smoker    Packs/day: 0.50    Types: Cigarettes  . Smokeless tobacco: Never Used  . Alcohol use No    Review of Systems  Constitutional: No fever/chills     Gastrointestinal: No abdominal pain.  No nausea, no vomiting.   Genitourinary:as above, no vaginal discharge Musculoskeletal: Negative for back pain. Skin: Negative for rash. Neurological: Negative for headaches     ____________________________________________   PHYSICAL EXAM:  VITAL SIGNS: ED Triage Vitals  Enc Vitals Group     BP 07/31/16 1305 125/71     Pulse Rate 07/31/16 1305 68     Resp 07/31/16 1305 18     Temp 07/31/16 1305 98.2 F (36.8 C)     Temp  Source 07/31/16 1305 Oral     SpO2 07/31/16 1305 94 %     Weight 07/31/16 1305 167 lb (75.8 kg)     Height 07/31/16 1305 5' 7"  (1.702 m)     Head Circumference --      Peak Flow --      Pain Score 07/31/16 1319 8     Pain Loc --      Pain Edu? --      Excl. in [place]? --     Constitutional: Alert and oriented. No acute distress. Pleasant and interactive Eyes: Conjunctivae are normal.  Head: Atraumatic. Nose: No congestion/rhinnorhea. Mouth/Throat: Mucous membranes are moist.   Cardiovascular: Normal rate, regular rhythm.  Respiratory: Normal respiratory effort.  No  retractions. Genitourinary: deferred Musculoskeletal: No lower extremity tenderness nor edema.   Neurologic:  Normal speech and language. No gross focal neurologic deficits are appreciated.   Skin:  Skin is warm, dry and intact. No rash noted.   ____________________________________________   LABS (all labs ordered are listed, but only abnormal results are displayed)  Labs Reviewed  URINALYSIS COMPLETEWITH MICROSCOPIC ([place]) - Abnormal; Notable for the following:       Result Value   Color, Urine YELLOW (*)    APPearance CLOUDY (*)    Hgb urine dipstick 3+ (*)    Protein, ur 100 (*)    Leukocytes, UA 2+ (*)    Bacteria, UA RARE (*)    Squamous Epithelial / LPF 0-5 (*)    All other components within normal limits  URINE CULTURE   ____________________________________________  EKG   ____________________________________________  RADIOLOGY  None ____________________________________________   PROCEDURES  Procedure(s) performed: No    Critical Care performed: No ____________________________________________   INITIAL IMPRESSION / ASSESSMENT AND PLAN / ED COURSE  Pertinent labs & imaging results that were available during my care of the patient were reviewed by me and considered in my medical decision making (see chart for details).  Emphasized the need for urology follow-up given 3 weeks of hematuria. We will try Macrobid for suspected UTI. Return precautions discussed   ____________________________________________   FINAL CLINICAL IMPRESSION(S) / ED DIAGNOSES  Final diagnoses:  Hematuria      NEW MEDICATIONS STARTED DURING THIS VISIT:  Discharge Medication List as of 07/31/2016  2:13 PM    START taking these medications   Details  nitrofurantoin, macrocrystal-monohydrate, (MACROBID) 100 MG capsule Take 1 capsule (100 mg total) by mouth 2 (two) times daily., Starting Sun 07/31/2016, Until Sun 08/07/2016, Print         Note:  This document was  prepared using Dragon voice recognition software and may include unintentional dictation errors.    Lavonia Drafts, MD 07/31/16 [phone]

## 2016-07-31 NOTE — ED Triage Notes (Signed)
Patient states "I have been peeing blood X3 weeks. I am unable to control when I urinate it just comes out. I am also experiencing pain when I urinate" Dr Vira Agar gave patient antibiotic for her hematuria and patient states "It has not gotten better"

## 2016-08-01 LAB — URINE CULTURE

## 2016-08-08 ENCOUNTER — Ambulatory Visit (INDEPENDENT_AMBULATORY_CARE_PROVIDER_SITE_OTHER): Payer: Medicare Other | Admitting: Urology

## 2016-08-08 ENCOUNTER — Encounter: Payer: Self-pay | Admitting: Urology

## 2016-08-08 VITALS — BP 141/69 | HR 78 | Ht 67.0 in | Wt 167.7 lb

## 2016-08-08 DIAGNOSIS — R3129 Other microscopic hematuria: Secondary | ICD-10-CM

## 2016-08-08 DIAGNOSIS — N390 Urinary tract infection, site not specified: Secondary | ICD-10-CM | POA: Diagnosis not present

## 2016-08-08 LAB — URINALYSIS, COMPLETE
BILIRUBIN UA: NEGATIVE
Glucose, UA: NEGATIVE
KETONES UA: NEGATIVE
LEUKOCYTES UA: NEGATIVE
NITRITE UA: NEGATIVE
PH UA: 5.5 (ref 5.0–7.5)
Protein, UA: NEGATIVE
RBC UA: NEGATIVE
Urobilinogen, Ur: 0.2 mg/dL (ref 0.2–1.0)

## 2016-08-08 LAB — MICROSCOPIC EXAMINATION

## 2016-08-08 LAB — BLADDER SCAN AMB NON-IMAGING: Scan Result: 0

## 2016-08-08 NOTE — [place] Notes (Signed)
08/08/2016 2:29 PM   Rebecca Escobar August 02, 1961 [id number]  Referring provider: Marval Regal, NP [place], [place] [address]  Chief Complaint  Patient presents with  . Recurrent UTI    referred by Dr. Vira Agar    HPI: Patient is a 55 -year-old Caucasian female who is referred to [place] by, Marval Regal, NP, for recurrent urinary tract infections.  Patient states that she has had four urinary tract infections over the last year.  She states her GI doctor has been checking urine dips when she has had symptoms.    Her symptoms with a urinary tract infection consist of urgency, urge incontinence and suprapubic pain.  She is currently on Macrobid.    She endorses dysuria.   She denies gross hematuria, suprapubic pain, back pain, abdominal pain or flank pain.  She has not had any recent fevers, chills, nausea or vomiting.   She does not have a history of nephrolithiasis, GU surgery or GU trauma.  Reviewing her records,  she has had one documented urine culture on 02/24/2016 positive for E. coli .    She is not sexually active.  She is post menopausal.  She admits to diarrhea.   She does engage in good perineal hygiene.  She does not take tub baths.   She has incontinence.  She is not using incontinence pads.   Her PVR is 0 mL.      She is drinking 8 to 10 glasses of water daily.    She also has been having episodes of microscopic hematuria.   Patient was found to have microscopic hematuria on two occasions with up to > 50 RBC's/hpf.   She does not have a prior history of documented recurrent urinary tract infections, nephrolithiasis, trauma to the genitourinary tract or malignancies of the genitourinary tract.   She does not have a family medical history of nephrolithiasis, malignancies of the genitourinary tract or hematuria.   Today, she is having symptoms of frequent urination, urgency, dysuria, nocturia, incontinence, hesitancy,  intermittency or a weak urinary stream.  Her UA today demonstrates 0-2 RBC's/hpf.    She has not had any recent imaging studies.   She is a smoker.   15 ppd history.     PMH: Past Medical History:  Diagnosis Date  . Anemia   . Anxiety   . Cervical cancer ([place])   . Crohn's disease ([place])   . GERD (gastroesophageal reflux disease)   . Tuberculosis    treated    Surgical History: Past Surgical History:  Procedure Laterality Date  . ABDOMINAL HYSTERECTOMY    . CESAREAN SECTION    . COLON SURGERY    . COLONOSCOPY WITH PROPOFOL N/A 03/02/2016   Procedure: COLONOSCOPY WITH PROPOFOL;  Surgeon: Manya Silvas, MD;  Location[facility] ENDOSCOPY;  Service: Endoscopy;  Laterality: N/A;    Home Medications:    Medication List       Accurate as of 08/08/16  2:29 PM. Always use your most recent med list.          amoxicillin-clavulanate 875-125 MG tablet Commonly known as:  AUGMENTIN Take 1 tablet by mouth 2 (two) times daily.   Certolizumab Pegol 2 X 200 MG/ML Kit Inject 400 mg into the skin every 30 (thirty) days.   cyanocobalamin 1000 MCG tablet Inject 1,000 mcg as directed every 30 (thirty) days.   cyanocobalamin 1000 MCG/ML injection Commonly known as:  (VITAMIN B-12) Inject into  the muscle.   HYDROcodone-acetaminophen 5-325 MG tablet Commonly known as:  NORCO/VICODIN Take 1 tablet by mouth every 4 (four) hours as needed.   meclizine 12.5 MG tablet Commonly known as:  ANTIVERT Take 12.5 mg by mouth 3 (three) times daily as needed for dizziness.   multivitamin tablet Take 1 tablet by mouth daily.   MULTI-VITAMINS Tabs Take by mouth.   omeprazole 20 MG capsule Commonly known as:  PRILOSEC Take 20 mg by mouth daily.   predniSONE 10 MG (21) Tbpk tablet Commonly known as:  STERAPRED UNI-PAK 21 TAB Take 6 tablets on day 1 Take 5 tablets on day 2 Take 4 tablets on day 3 Take 3 tablets on day 4 Take 2 tablets on day 5 Take 1 tablet on day 6   traMADol 50 MG  tablet Commonly known as:  ULTRAM Take by mouth.       Allergies:  Allergies  Allergen Reactions  . Nsaids     Due to Crohns disease    Family History: Family History  Problem Relation Age of Onset  . Kidney disease Neg Hx   . Bladder Cancer Neg Hx     Social History:  reports that she has been smoking Cigarettes.  She has been smoking about 0.50 packs per day. She has never used smokeless tobacco. She reports that she does not drink alcohol or use drugs.  ROS: UROLOGY Frequent Urination?: Yes Hard to postpone urination?: Yes Burning/pain with urination?: Yes Get up at night to urinate?: Yes Leakage of urine?: Yes Urine stream starts and stops?: Yes Trouble starting stream?: Yes Do you have to strain to urinate?: No Blood in urine?: No Urinary tract infection?: No Sexually transmitted disease?: No Injury to kidneys or bladder?: No Painful intercourse?: No Weak stream?: Yes Currently pregnant?: No Vaginal bleeding?: No Last menstrual period?: n  Gastrointestinal Nausea?: No Vomiting?: No Indigestion/heartburn?: No Diarrhea?: No Constipation?: No  Constitutional Fever: No Night sweats?: No Weight loss?: No Fatigue?: No  Skin Skin rash/lesions?: No Itching?: No  Eyes Blurred vision?: No Double vision?: No  Ears/Nose/Throat Sore throat?: No Sinus problems?: No  Hematologic/Lymphatic Swollen glands?: No Easy bruising?: No  Cardiovascular Leg swelling?: No Chest pain?: No  Respiratory Cough?: No Shortness of breath?: No  Endocrine Excessive thirst?: No  Musculoskeletal Back pain?: No Joint pain?: No  Neurological Headaches?: No Dizziness?: No  Psychologic Depression?: No Anxiety?: No  Physical Exam: BP (!) 141/69   Pulse 78   Ht _0  (1.702 m)   Wt 167 lb 11.2 oz (76.1 kg)   BMI 26.27 kg/m   Constitutional: Well nourished. Alert and oriented, No acute distress. HEENT: [place] AT, moist mucus membranes. Trachea midline, no  masses. Cardiovascular: No clubbing, cyanosis, or edema. Respiratory: Normal respiratory effort, no increased work of breathing. GI: Abdomen is soft, non tender, non distended, no abdominal masses. Liver and spleen not palpable.  No hernias appreciated.  Stool sample for occult testing is not indicated.   GU: No CVA tenderness.  No bladder fullness or masses.   Skin: No rashes, bruises or suspicious lesions. Lymph: No cervical or inguinal adenopathy. Neurologic: Grossly intact, no focal deficits, moving all 4 extremities. Psychiatric: Normal mood and affect.  Laboratory Data: Lab Results  Component Value Date   WBC 13.1 (H) 04/29/2016   HGB 13.0 04/29/2016   HCT 38.0 04/29/2016   MCV 95.3 04/29/2016   PLT 197 04/29/2016    Lab Results  Component Value Date   CREATININE 0.70  04/29/2016    Lab Results  Component Value Date   AST 16 04/29/2016   Lab Results  Component Value Date   ALT 9 (L) 04/29/2016     Urinalysis UA unremarkable.  See EPIC.    Assessment & Plan:    1. Microscopic hematuria  -  I explained to the patient that there are a number of causes that can be associated with blood in the urine, such as stones, UTI's, damage to the urinary tract and/or cancer.  At this time, I felt that the patient warranted further urologic evaluation.   The AUA guidelines state that a CT urogram is the preferred imaging study to evaluate hematuria.  I explained to the patient that a contrast material will be injected into a vein and that in rare instances, an allergic reaction can result and may even life threatening   The patient denies any allergies to contrast, iodine and/or seafood and is not taking metformin.  Her reproductive status is status post hysterectomy.    Following the imaging study,  I've recommended a cystoscopy. I described how this is performed, typically in an office setting with a flexible cystoscope. We described the risks, benefits, and possible side  effects, the most common of which is a minor amount of blood in the urine and/or burning which usually resolves in 24 to 48 hours.    The patient had the opportunity to ask questions which were answered. Based upon this discussion, the patient is willing to proceed. Therefore, I've ordered: a CT Urogram and cystoscopy.  She will return following all of the above for discussion of the results.     2. Recurrent UTI  - per patient's report  - one documented infection  - continue to monitor  - BLADDER SCAN AMB NON-IMAGING   Return for CT Urogram report and cystoscopy.  These notes generated with voice recognition software. I apologize for typographical errors.  Zara Council, [facility] [address] [place], [place] [address] [phone]

## 2016-08-09 LAB — BUN+CREAT
BUN/Creatinine Ratio: 20 (ref 9–23)
BUN: 14 mg/dL (ref 6–24)
Creatinine, Ser: 0.7 mg/dL (ref 0.57–1.00)
GFR, EST AFRICAN AMERICAN: 112 mL/min/{1.73_m2} (ref >59)
GFR, EST NON AFRICAN AMERICAN: 97 mL/min/{1.73_m2} (ref >59)

## 2016-08-11 LAB — CULTURE, URINE COMPREHENSIVE

## 2016-08-25 ENCOUNTER — Ambulatory Visit
Admission: RE | Admit: 2016-08-25 | Discharge: 2016-08-25 | Disposition: A | Discharge status: Home / Self Care | Payer: Medicare Other | Source: Ambulatory Visit | Attending: Urology | Admitting: Urology

## 2016-08-25 DIAGNOSIS — R911 Solitary pulmonary nodule: Secondary | ICD-10-CM | POA: Insufficient documentation

## 2016-08-25 DIAGNOSIS — R935 Abnormal findings on diagnostic imaging of other abdominal regions, including retroperitoneum: Secondary | ICD-10-CM | POA: Insufficient documentation

## 2016-08-25 DIAGNOSIS — I700 Atherosclerosis of aorta: Secondary | ICD-10-CM | POA: Insufficient documentation

## 2016-08-25 DIAGNOSIS — J432 Centrilobular emphysema: Secondary | ICD-10-CM | POA: Diagnosis not present

## 2016-08-25 DIAGNOSIS — R3129 Other microscopic hematuria: Secondary | ICD-10-CM | POA: Diagnosis present

## 2016-08-25 MED ORDER — IOPAMIDOL (ISOVUE-370) INJECTION 76%
100.0000 mL | Freq: Once | INTRAVENOUS | Status: AC | PRN
  Administered 2016-08-25: 100 mL via INTRAVENOUS

## 2016-08-30 ENCOUNTER — Other Ambulatory Visit: Payer: Self-pay | Admitting: Internal Medicine

## 2016-08-30 DIAGNOSIS — R911 Solitary pulmonary nodule: Secondary | ICD-10-CM

## 2016-09-01 ENCOUNTER — Ambulatory Visit (INDEPENDENT_AMBULATORY_CARE_PROVIDER_SITE_OTHER): Payer: Medicare Other | Admitting: Urology

## 2016-09-01 ENCOUNTER — Encounter: Payer: Self-pay | Admitting: Urology

## 2016-09-01 VITALS — BP 109/79 | HR 68 | Ht 67.0 in | Wt 167.3 lb

## 2016-09-01 DIAGNOSIS — R3129 Other microscopic hematuria: Secondary | ICD-10-CM | POA: Diagnosis not present

## 2016-09-01 DIAGNOSIS — K50919 Crohn's disease, unspecified, with unspecified complications: Secondary | ICD-10-CM

## 2016-09-01 DIAGNOSIS — R911 Solitary pulmonary nodule: Secondary | ICD-10-CM

## 2016-09-01 LAB — URINALYSIS, COMPLETE
BILIRUBIN UA: NEGATIVE
GLUCOSE, UA: NEGATIVE
KETONES UA: NEGATIVE
Leukocytes, UA: NEGATIVE
NITRITE UA: NEGATIVE
Protein, UA: NEGATIVE
RBC UA: NEGATIVE
SPEC GRAV UA: 1.02 (ref 1.005–1.030)
Urobilinogen, Ur: 0.2 mg/dL (ref 0.2–1.0)
pH, UA: 7 (ref 5.0–7.5)

## 2016-09-01 LAB — MICROSCOPIC EXAMINATION: Bacteria, UA: NONE SEEN

## 2016-09-01 MED ORDER — CIPROFLOXACIN HCL 500 MG PO TABS
500.0000 mg | ORAL_TABLET | Freq: Once | ORAL | Status: AC
Start: 2016-09-01 — End: 2016-09-01
  Administered 2016-09-01: 500 mg via ORAL

## 2016-09-01 MED ORDER — LIDOCAINE HCL 2 % EX GEL
1.0000 "application " | Freq: Once | CUTANEOUS | Status: AC
  Administered 2016-09-01: 1 via URETHRAL

## 2016-09-01 NOTE — [place] Notes (Signed)
09/01/2016 10:43 AM   Rebecca Escobar [birth year] [id number]  Referring provider: Ellamae Sia, MD No address on file  No chief complaint on file.   HPI: The patient is a 55 year old female presents today for completion of her microscopic hematuria workup.  Her CT was negative for source of hematuria. He did have an incidental finding of a 1.7 cm right lower lobe pulmonary nodule. The radiologist recommended a CT of the chest in 3-6 months for follow-up. She also had wall thickening and mucosal hyperenhancement throughout her neoterminal ileum suggesting Crohn's disease. She also had mild wall thickening in the sigmoid colon. She did have a colonoscopy in March 2017. She does have a history of Crohn's disease and is followed by gastroenterology.   PMH: Past Medical History:  Diagnosis Date  . Anemia   . Anxiety   . Cervical cancer ([place])   . Crohn's disease ([employer])   . GERD (gastroesophageal reflux disease)   . Tuberculosis    treated    Surgical History: Past Surgical History:  Procedure Laterality Date  . ABDOMINAL HYSTERECTOMY    . CESAREAN SECTION    . COLON SURGERY    . COLONOSCOPY WITH PROPOFOL N/A 03/02/2016   Procedure: COLONOSCOPY WITH PROPOFOL;  Surgeon: Manya Silvas, MD;  Location: [facility] ENDOSCOPY;  Service: Endoscopy;  Laterality: N/A;    Home Medications:    Medication List       Accurate as of 09/01/16 10:43 AM. Always use your most recent med list.          amoxicillin-clavulanate 875-125 MG tablet Commonly known as:  AUGMENTIN Take 1 tablet by mouth 2 (two) times daily.   Certolizumab Pegol 2 X 200 MG/ML Kit Inject 400 mg into the skin every 30 (thirty) days.   cyanocobalamin 1000 MCG tablet Inject 1,000 mcg as directed every 30 (thirty) days.   cyanocobalamin 1000 MCG/ML injection Commonly known as:  (VITAMIN B-12) Inject into the muscle.   HYDROcodone-acetaminophen 5-325 MG tablet Commonly known as:  NORCO/VICODIN Take 1 tablet by  mouth every 4 (four) hours as needed.   meclizine 12.5 MG tablet Commonly known as:  ANTIVERT Take 12.5 mg by mouth 3 (three) times daily as needed for dizziness.   multivitamin tablet Take 1 tablet by mouth daily.   MULTI-VITAMINS Tabs Take by mouth.   omeprazole 20 MG capsule Commonly known as:  PRILOSEC Take 20 mg by mouth daily.   predniSONE 10 MG (21) Tbpk tablet Commonly known as:  STERAPRED UNI-PAK 21 TAB Take 6 tablets on day 1 Take 5 tablets on day 2 Take 4 tablets on day 3 Take 3 tablets on day 4 Take 2 tablets on day 5 Take 1 tablet on day 6   traMADol 50 MG tablet Commonly known as:  ULTRAM Take by mouth.       Allergies:  Allergies  Allergen Reactions  . Nsaids     Due to Crohns disease    Family History: Family History  Problem Relation Age of Onset  . Kidney disease Neg Hx   . Bladder Cancer Neg Hx     Social History:  reports that she has been smoking Cigarettes.  She has been smoking about 0.50 packs per day. She has never used smokeless tobacco. She reports that she does not drink alcohol or use drugs.  ROS:  Physical Exam: There were no vitals taken for this visit.  Constitutional:  Alert and oriented, No acute distress. HEENT: [place] AT, moist mucus membranes.  Trachea midline, no masses. Cardiovascular: No clubbing, cyanosis, or edema. Respiratory: Normal respiratory effort, no increased work of breathing. GI: Abdomen is soft, nontender, nondistended, no abdominal masses GU: No CVA tenderness.  Skin: No rashes, bruises or suspicious lesions. Lymph: No cervical or inguinal adenopathy. Neurologic: Grossly intact, no focal deficits, moving all 4 extremities. Psychiatric: Normal mood and affect.  Laboratory Data: Lab Results  Component Value Date   WBC 13.1 (H) 04/29/2016   HGB 13.0 04/29/2016   HCT 38.0 04/29/2016   MCV 95.3 04/29/2016   PLT 197 04/29/2016    Lab Results    Component Value Date   CREATININE 0.70 08/08/2016    No results found for: PSA  No results found for: TESTOSTERONE  No results found for: HGBA1C  Urinalysis    Component Value Date/Time   COLORURINE YELLOW (A) 07/31/2016 1310   APPEARANCEUR Cloudy (A) 08/08/2016 1422   LABSPEC 1.017 07/31/2016 1310   LABSPEC 1.026 05/20/2012 0028   PHURINE 5.0 07/31/2016 1310   GLUCOSEU Negative 08/08/2016 1422   GLUCOSEU Negative 05/20/2012 0028   HGBUR 3+ (A) 07/31/2016 1310   BILIRUBINUR Negative 08/08/2016 1422   BILIRUBINUR Negative 05/20/2012 0028   KETONESUR NEGATIVE 07/31/2016 1310   PROTEINUR Negative 08/08/2016 1422   PROTEINUR 100 (A) 07/31/2016 1310   NITRITE Negative 08/08/2016 1422   NITRITE NEGATIVE 07/31/2016 1310   LEUKOCYTESUR Negative 08/08/2016 1422   LEUKOCYTESUR 1+ 05/20/2012 0028    Pertinent Imaging: CLINICAL DATA:  Microhematuria.  EXAM: CT ABDOMEN AND PELVIS WITHOUT AND WITH CONTRAST  TECHNIQUE: Multidetector CT imaging of the abdomen and pelvis was performed following the standard protocol before and following the bolus administration of intravenous contrast.  CONTRAST:  100 cc Isovue 370 IV.  COMPARISON:  10/14/2010 CT abdomen/ pelvis.  FINDINGS: Lower chest: Centrilobular emphysema at the lung bases. Subsolid 1.7 x 1.1 cm right lower lobe pulmonary nodule with solid 0.4 cm component (series 13/image 6), which appears new since 09/05/2007.  Hepatobiliary: Normal liver with no liver mass. Normal gallbladder with no radiopaque cholelithiasis. No biliary ductal dilatation.  Pancreas: Normal, with no mass or duct dilation.  Spleen: Normal size. No mass.  Adrenals/Urinary Tract: Normal adrenals. No renal stones. No hydronephrosis . Normal caliber ureters, with no ureteral stones. No renal cortical mass. On delayed imaging, there is no urothelial wall thickening and there are no filling defects in the opacified portions of the bilateral  collecting systems or ureters, noting limited opacification of the pelvic segment of the right ureter, limiting evaluation in this location. No bladder stones. No bladder wall thickening. No bladder mass. No bladder diverticulum.  Stomach/Bowel: Grossly normal stomach. The patient is status post ileocecal resection with neo ileocolic anastomosis in the right abdomen. There is wall thickening and mucosal hyperenhancement throughout the neo terminal ileum. Normal caliber small bowel. No additional sites of small bowel wall thickening. There are multiple segments of borderline mild wall thickening in the sigmoid colon. Otherwise normal remnant colon with no abnormal colonic dilatation.  Vascular/Lymphatic: Atherosclerotic nonaneurysmal abdominal aorta. Patent portal, splenic, hepatic and renal veins. No pathologically enlarged lymph nodes in the abdomen or pelvis.  Reproductive: Status post hysterectomy, with no abnormal findings at the vaginal cuff. No adnexal mass.  Other: No pneumoperitoneum, ascites or focal fluid collection.  Musculoskeletal: No aggressive appearing focal osseous lesions. Moderate thoracolumbar spondylosis,  most prominent at L5-S1.  IMPRESSION: 1. No CT findings to explain the hematuria. No urolithiasis. No renal cortical masses. No urothelial lesions, with limitations as described. No evidence of urinary tract obstruction. 2. **An incidental finding of potential clinical significance has been found. Subsolid 1.7 cm right lower lobe pulmonary nodule with solid 0.4 cm component, new since 2008 chest CT. Follow-up non-contrast CT recommended at 3-6 months to confirm persistence. If unchanged, and solid component remains <6 mm, annual CT is recommended until 5 years of stability has been established. If persistent these nodules should be considered highly suspicious if the solid component of the nodule is 6 mm or greater in size and enlarging. This  recommendation follows the consensus statement: Guidelines for Management of Incidental Pulmonary Nodules Detected on CT Images:From the Fleischner Society 2017; published online before print (10.1148/radiol.6389373428).** 3. Wall thickening and mucosal hyperenhancement throughout the neoterminal ileum, suggesting active Crohn neoterminal ileitis. Nonspecific borderline mild wall thickening in the sigmoid colon, which could indicate active Crohn colitis. Consider correlation with colonoscopy if not recently performed. 4. Additional findings include aortic atherosclerosis and centrilobular emphysema.    Cystoscopy Procedure Note  Patient identification was confirmed, informed consent was obtained, and patient was prepped using Betadine solution.  Lidocaine jelly was administered per urethral meatus.    Preoperative abx where received prior to procedure.    Procedure: - Flexible cystoscope introduced, without any difficulty.   - Thorough search of the bladder revealed:    normal urethral meatus    normal urothelium    no stones    no ulcers     no tumors    no urethral polyps    no trabeculation  - Ureteral orifices were normal in position and appearance.  Post-Procedure: - Patient tolerated the procedure well   Assessment & Plan:    1. Microscopic hematuria -negative workup. Follow-up in one year for repeat urinalysis  2. Lung nodule She has a scheduled CT of her chest for follow-up as ordered by her PCP.  3. Crohn's disease This is evident on her CT scan. She is 28 followed by gastroenterology. She'll follow-up with them as previously scheduled.  There are no diagnoses linked to this encounter.  No Follow-up on file.  Nickie Retort, MD  [facility] [address], [place], [place] [address] [phone]

## 2016-09-08 ENCOUNTER — Ambulatory Visit
Admission: RE | Admit: 2016-09-08 | Discharge: 2016-09-08 | Disposition: A | Discharge status: Home / Self Care | Payer: Medicare Other | Source: Ambulatory Visit | Attending: Internal Medicine | Admitting: Internal Medicine

## 2016-09-08 DIAGNOSIS — R911 Solitary pulmonary nodule: Secondary | ICD-10-CM

## 2016-10-21 IMAGING — CT CT CHEST W/O CM
2 of 4 series · 15 of 36 positions shown, 18 images · non-contrast
Comparison: 08/25/2016

CLINICAL DATA: Incidental nodule found on recent CT of the abdomen

EXAM:
CT CHEST WITHOUT CONTRAST
TECHNIQUE: Multidetector CT imaging of the chest was performed following the
standard protocol without IV contrast.

[Series 2: thorax · axial · 0.66mm/px · z∈[-569,-275]mm · 12 of 173 slices shown, 15 images]
[im 13/173  mediastinal]
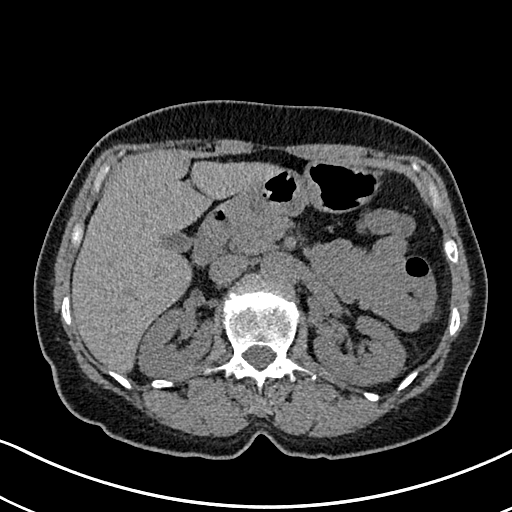
[im 13/173  lung]
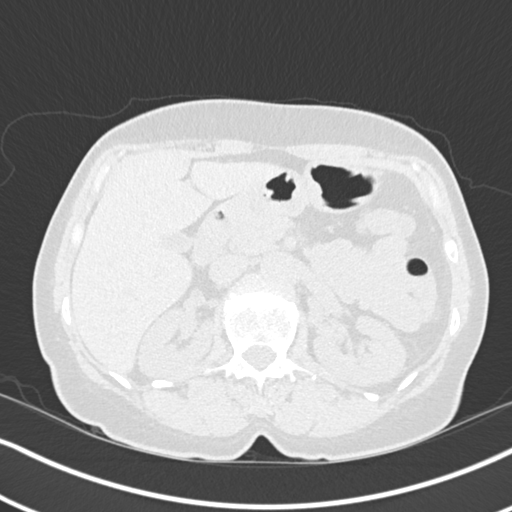
[im 25/173  lung]
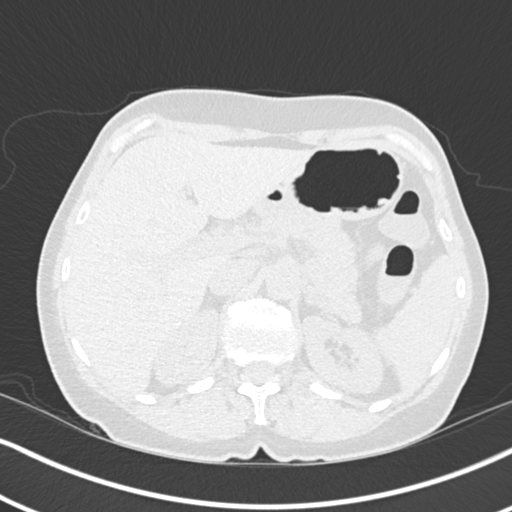
[im 37/173  lung]
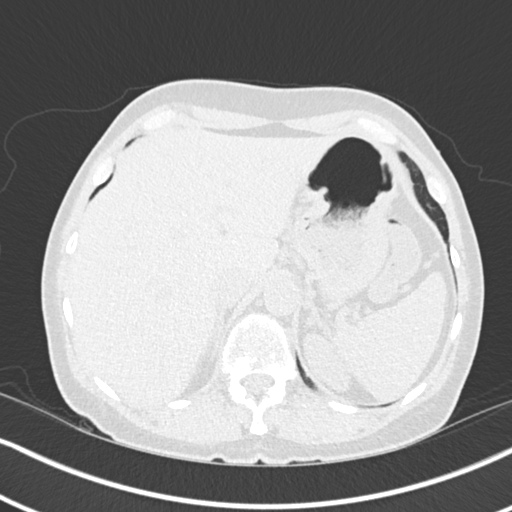
[im 50/173  lung]
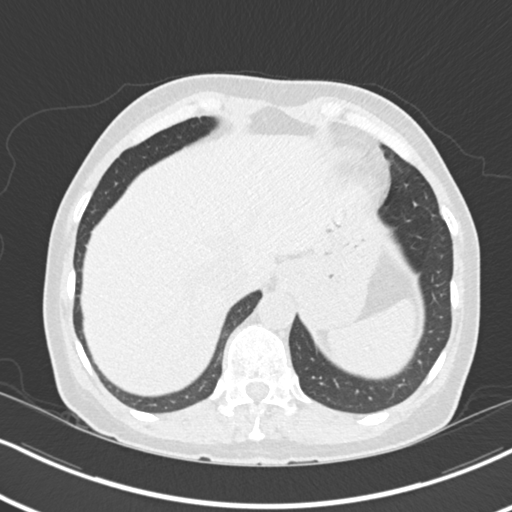
[im 62/173  mediastinal]
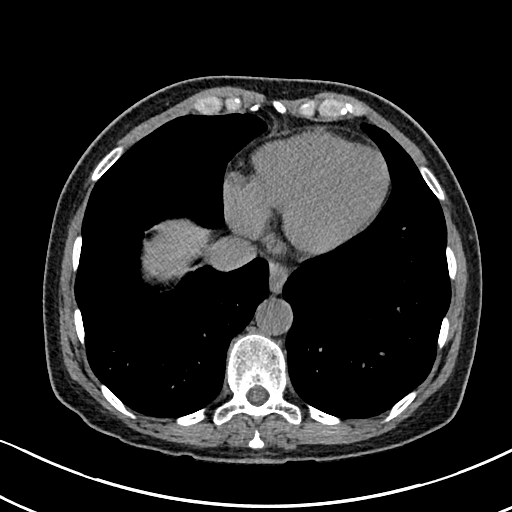
[im 62/173  lung]
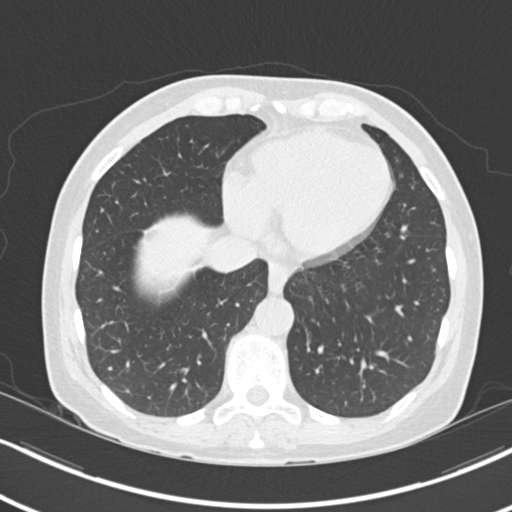
[im 74/173  lung]
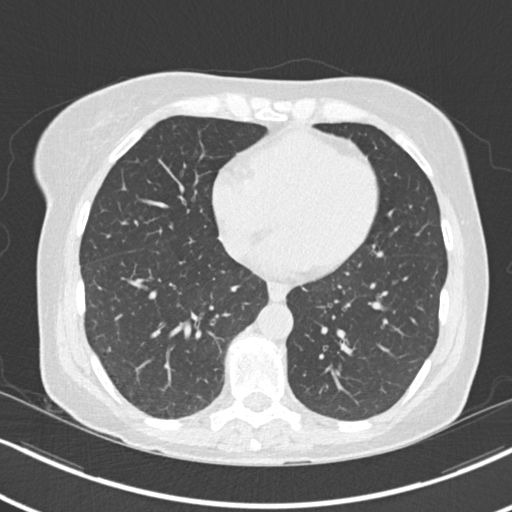
[im 99/173  lung]
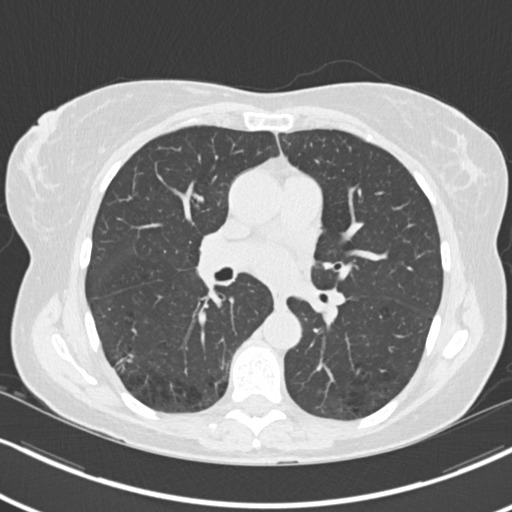
[im 111/173  lung]
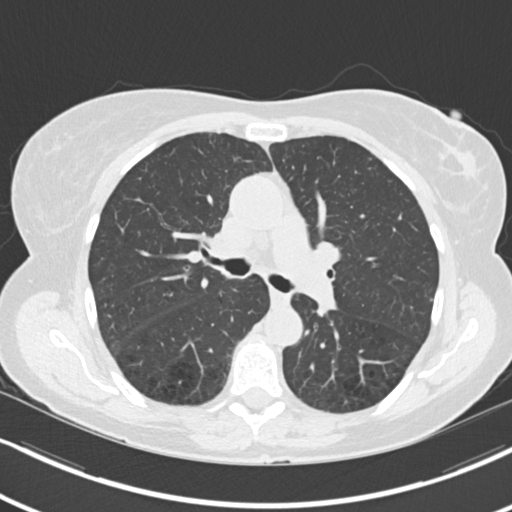
[im 123/173  mediastinal]
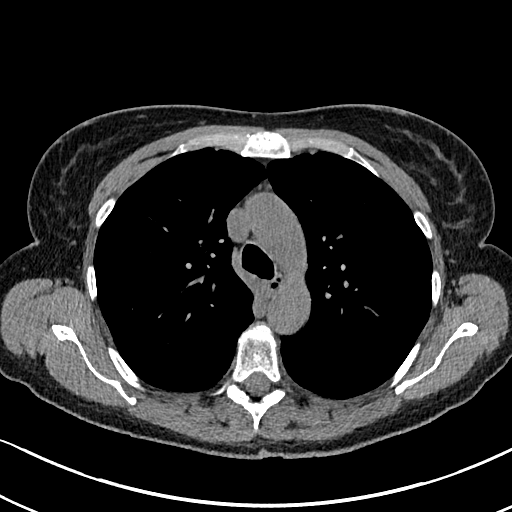
[im 123/173  lung]
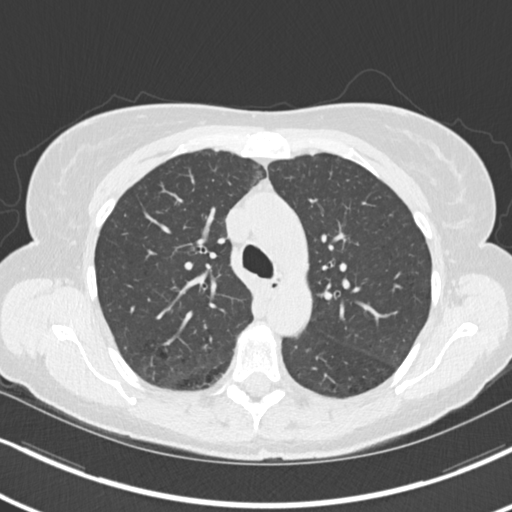
[im 136/173  lung]
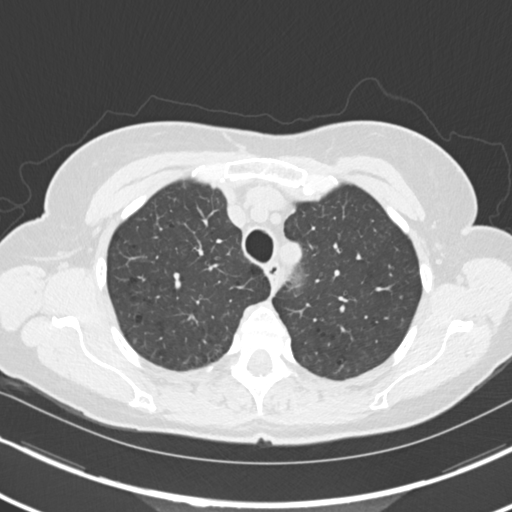
[im 148/173  lung]
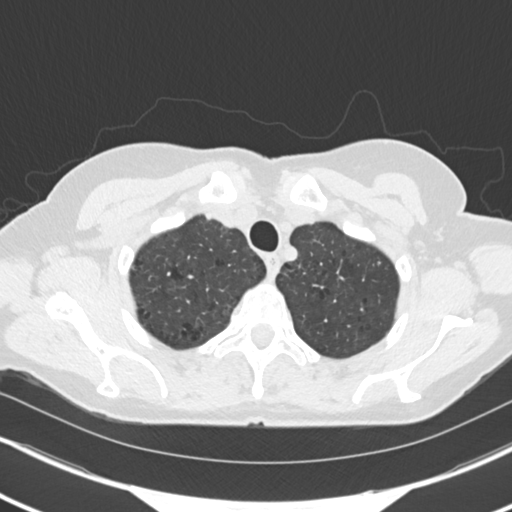
[im 160/173  lung]
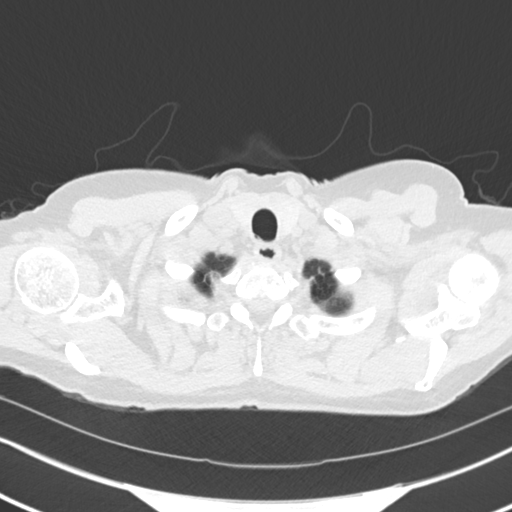

[Series 5: coronal · coronal · 0.67mm/px · 3 of 122 slices shown]
[im 25/122  lung]
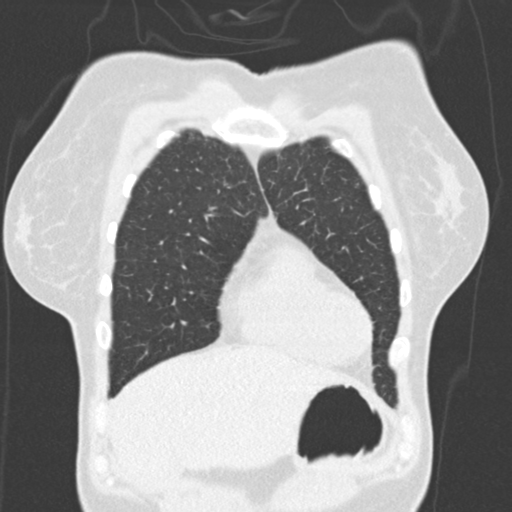
[im 49/122  lung]
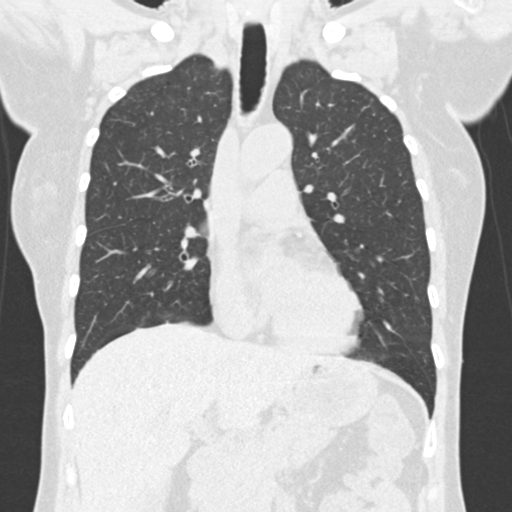
[im 73/122  lung]
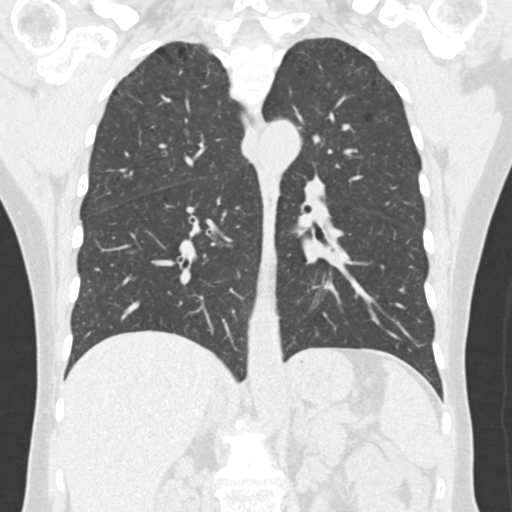

[15 of 36 positions shown; findings below may reference images not displayed]

FINDINGS: Cardiovascular: Somewhat limited due to lack of IV contrast. Aortic
calcifications are seen without significant aneurysmal dilatation.
Mild coronary calcifications are noted in the left anterior
descending coronary artery.

Mediastinum/Nodes: No hilar or mediastinal adenopathy is noted. No
axillary adenopathy is seen. The thoracic inlet is within normal
limits.

Lungs/Pleura: The lungs are well aerated bilaterally. Mild
emphysematous changes are seen. The previously seen sub solid nodule
in the right lower lobe is again identified measuring approximately
17 mm in greatest dimension. The largest solid component within
measures approximately 6-7 mm. This lesion was not present in 9441.
No other nodular changes are seen. Few scattered calcified
granulomas are seen. No focal infiltrate is noted. No sizable
effusion is seen.

Upper Abdomen: Within normal limits.

Musculoskeletal: No chest wall mass or suspicious bone lesions
identified.
IMPRESSION: Stable appearing 17 mm sub solid nodule in the right lower lobe
laterally. Largest solid component measures at approximately 6 mm.

Follow-up non-contrast CT recommended at 3-6 months to confirm
persistence. If unchanged, and solid component remains <6 mm, annual
CT is recommended until 5 years of stability has been established.
If persistent these nodules should be considered highly suspicious
if the solid component of the nodule is 6 mm or greater in size and
enlarging. This recommendation follows the consensus statement:
Guidelines for Management of Incidental Pulmonary Nodules Detected
[DATE].

No other acute abnormality is noted.

## 2016-11-08 ENCOUNTER — Other Ambulatory Visit: Payer: Self-pay | Admitting: Internal Medicine

## 2016-11-08 DIAGNOSIS — Z1231 Encounter for screening mammogram for malignant neoplasm of breast: Secondary | ICD-10-CM

## 2016-11-08 DIAGNOSIS — R918 Other nonspecific abnormal finding of lung field: Secondary | ICD-10-CM

## 2016-12-20 ENCOUNTER — Ambulatory Visit
Admission: RE | Admit: 2016-12-20 | Discharge: 2016-12-20 | Disposition: A | Discharge status: Home / Self Care | Payer: Medicare Other | Source: Ambulatory Visit | Attending: Internal Medicine | Admitting: Internal Medicine

## 2016-12-20 DIAGNOSIS — Z1231 Encounter for screening mammogram for malignant neoplasm of breast: Secondary | ICD-10-CM | POA: Insufficient documentation

## 2016-12-22 ENCOUNTER — Encounter: Payer: Self-pay | Admitting: *Deleted

## 2016-12-22 ENCOUNTER — Emergency Department
Admission: EM | Admit: 2016-12-22 | Discharge: 2016-12-22 | Disposition: A | Discharge status: Home / Self Care | Payer: Medicare Other | Attending: Student in an Organized Health Care Education/Training Program | Admitting: Student in an Organized Health Care Education/Training Program

## 2016-12-22 DIAGNOSIS — R350 Frequency of micturition: Secondary | ICD-10-CM | POA: Diagnosis present

## 2016-12-22 DIAGNOSIS — N3000 Acute cystitis without hematuria: Secondary | ICD-10-CM | POA: Insufficient documentation

## 2016-12-22 DIAGNOSIS — Z79899 Other long term (current) drug therapy: Secondary | ICD-10-CM | POA: Insufficient documentation

## 2016-12-22 DIAGNOSIS — F17210 Nicotine dependence, cigarettes, uncomplicated: Secondary | ICD-10-CM | POA: Diagnosis not present

## 2016-12-22 LAB — URINALYSIS, COMPLETE (UACMP) WITH MICROSCOPIC
BILIRUBIN URINE: NEGATIVE
Bacteria, UA: NONE SEEN
Glucose, UA: NEGATIVE mg/dL
Hgb urine dipstick: NEGATIVE
KETONES UR: NEGATIVE mg/dL
Nitrite: NEGATIVE
PH: 5 (ref 5.0–8.0)
Protein, ur: NEGATIVE mg/dL
Specific Gravity, Urine: 1.021 (ref 1.005–1.030)

## 2016-12-22 MED ORDER — PHENAZOPYRIDINE HCL 200 MG PO TABS
200.0000 mg | ORAL_TABLET | Freq: Once | ORAL | Status: AC
  Administered 2016-12-22: 200 mg via ORAL
  Filled 2016-12-22: qty 1

## 2016-12-22 MED ORDER — LIDOCAINE HCL (PF) 1 % IJ SOLN
2.1000 mL | Freq: Once | INTRAMUSCULAR | Status: AC
  Administered 2016-12-22: 2.1 mL
  Filled 2016-12-22: qty 5

## 2016-12-22 MED ORDER — PHENAZOPYRIDINE HCL 200 MG PO TABS: 200.0000 mg | ORAL_TABLET | Freq: Three times a day (TID) | ORAL | 0 refills | Status: AC | PRN

## 2016-12-22 MED ORDER — CEFTRIAXONE SODIUM 1 G IJ SOLR
1.0000 g | Freq: Once | INTRAMUSCULAR | Status: AC
  Administered 2016-12-22: 1 g via INTRAMUSCULAR
  Filled 2016-12-22: qty 10

## 2016-12-22 NOTE — ED Triage Notes (Signed)
Pt has a uti for 1 week and pt continues to have back pain and urinary frequency and incontinence.  Sx worse tonight.  Taking septra ds.

## 2016-12-22 NOTE — ED Provider Notes (Signed)
[facility] Emergency Department Provider Note  ____________________________________________  Time seen: Approximately 9:47 PM  I have reviewed the triage vital signs and the nursing notes.   HISTORY  Chief Complaint Urinary Frequency    HPI Rebecca Escobar is a 56 y.o. female who presents to the emergency department for evaluation of dysuria, urinary frequency, and urinary incontinence. She states that she has been evaluated by her primary care provider last week and started on Cipro for urinary tract infection. She states that her PCP called her on New Year's eve and changed her medication to Bactrim. She has been taking that for the past 3 days without improvement of her symptoms. She states that she has severe pain in her "urethra" and has to urinate every 10 minutes or so.  Past Medical History:  Diagnosis Date  . Anemia   . Anxiety   . Cervical cancer ([place])   . Crohn's disease ([place])   . GERD (gastroesophageal reflux disease)   . Tuberculosis    treated    There are no active problems to display for this patient.   Past Surgical History:  Procedure Laterality Date  . ABDOMINAL HYSTERECTOMY    . CESAREAN SECTION    . COLON SURGERY    . COLONOSCOPY WITH PROPOFOL N/A 03/02/2016   Procedure: COLONOSCOPY WITH PROPOFOL;  Surgeon: Manya Silvas, MD;  Location: [facility] ENDOSCOPY;  Service: Endoscopy;  Laterality: N/A;    Prior to Admission medications   Medication Sig Start Date End Date Taking? Authorizing Provider  Certolizumab Pegol 2 X 200 MG/ML KIT Inject 400 mg into the skin every 30 (thirty) days.    Historical Provider, MD  cyanocobalamin (,VITAMIN B-12,) 1000 MCG/ML injection Inject into the muscle. 05/18/16   Historical Provider, MD  cyanocobalamin 1000 MCG tablet Inject 1,000 mcg as directed every 30 (thirty) days.    Historical Provider, MD  HYDROcodone-acetaminophen (NORCO/VICODIN) 5-325 MG tablet Take 1 tablet by mouth every 4 (four)  hours as needed. Patient not taking: Reported on 09/01/2016 04/29/16   Victorino Dike, FNP  meclizine (ANTIVERT) 12.5 MG tablet Take 12.5 mg by mouth 3 (three) times daily as needed for dizziness.    Historical Provider, MD  Multiple Vitamin (MULTI-VITAMINS) TABS Take by mouth.    Historical Provider, MD  Multiple Vitamin (MULTIVITAMIN) tablet Take 1 tablet by mouth daily.    Historical Provider, MD  omeprazole (PRILOSEC) 20 MG capsule Take 20 mg by mouth daily.    Historical Provider, MD  phenazopyridine (PYRIDIUM) 200 MG tablet Take 1 tablet (200 mg total) by mouth 3 (three) times daily as needed for pain. 12/22/16 12/22/17  Victorino Dike, FNP  predniSONE (STERAPRED UNI-PAK 21 TAB) 10 MG (21) TBPK tablet Take 6 tablets on day 1 Take 5 tablets on day 2 Take 4 tablets on day 3 Take 3 tablets on day 4 Take 2 tablets on day 5 Take 1 tablet on day 6 Patient not taking: Reported on 09/01/2016 04/29/16   Victorino Dike, FNP  traMADol (ULTRAM) 50 MG tablet Take by mouth. 01/01/16   Historical Provider, MD    Allergies Nsaids  Family History  Problem Relation Age of Onset  . Kidney disease Neg Hx   . Bladder Cancer Neg Hx   . Breast cancer Neg Hx     Social History Social History  Substance Use Topics  . Smoking status: Current Every Day Smoker    Packs/day: 0.50    Types: Cigarettes  .  Smokeless tobacco: Never Used  . Alcohol use No    Review of Systems Constitutional: Negative for fever. Respiratory: Negative for shortness of breath or cough. Gastrointestinal: Negative for abdominal pain; negative for nausea , negative for vomiting. Genitourinary: Positive for dysuria , negative for vaginal discharge. Musculoskeletal: Negative for back pain. Skin: Negative for rash, lesion, wound. ____________________________________________   PHYSICAL EXAM:  VITAL SIGNS: ED Triage Vitals  Enc Vitals Group     BP 12/22/16 2132 135/81     Pulse Rate 12/22/16 2131 100     Resp 12/22/16 2131  20     Temp 12/22/16 2131 97.9 F (36.6 C)     Temp Source 12/22/16 2131 Oral     SpO2 12/22/16 2131 98 %     Weight 12/22/16 2132 165 lb (74.8 kg)     Height 12/22/16 2132 5' 7"  (1.702 m)     Head Circumference --      Peak Flow --      Pain Score 12/22/16 2132 10     Pain Loc --      Pain Edu? --      Excl. in [place]? --     Constitutional: Alert and oriented. Well appearing and in no acute distress. Eyes: Conjunctivae are normal. PERRL. EOMI. Head: Atraumatic. Nose: No congestion/rhinnorhea. Mouth/Throat: Mucous membranes are moist. Respiratory: Normal respiratory effort.  No retractions. Gastrointestinal: Suprapubic tenderness on palpation, otherwise abdomen is soft and nontender without rebound or guarding. Genitourinary: Pelvic exam: Not indicated, no CVA tenderness Musculoskeletal: No extremity tenderness nor edema.  Neurologic:  Normal speech and language. No gross focal neurologic deficits are appreciated. Speech is normal. No gait instability. Skin:  Skin is warm, dry and intact. No rash noted. Psychiatric: Mood and affect are normal. Speech and behavior are normal.  ____________________________________________   LABS (all labs ordered are listed, but only abnormal results are displayed)  Labs Reviewed  URINALYSIS, COMPLETE (UACMP) WITH MICROSCOPIC - Abnormal; Notable for the following:       Result Value   Color, Urine YELLOW (*)    APPearance CLEAR (*)    Leukocytes, UA SMALL (*)    Squamous Epithelial / LPF 0-5 (*)    All other components within normal limits   ____________________________________________  RADIOLOGY  Not indicated ____________________________________________   PROCEDURES  Procedure(s) performed: None  ____________________________________________   INITIAL IMPRESSION / ASSESSMENT AND PLAN / ED COURSE  Clinical Course      Pertinent labs & imaging results that were available during my care of the patient were reviewed by me and  considered in my medical decision making (see chart for details).  56 year old female presenting to the emergency department for further evaluation and treatment of urinary tract infection and dysuria. No evidence of pyelonephritis on physical exam. Tonight, she'll be given an injection of Rocephin and a dose of Pyridium. She was strongly encouraged to finish the Bactrim as prescribed by her primary care provider. She will also receive a prescription for the Pyridium. She was given strict return precautions for the emergency department including back pain, fever, nausea, or vomiting. She is to follow-up with her primary care provider early next week. She was also given a referral to see urology and she stated an intention to call tomorrow for an appointment. ____________________________________________   FINAL CLINICAL IMPRESSION(S) / ED DIAGNOSES  Final diagnoses:  Acute cystitis without hematuria    Note:  This document was prepared using Dragon voice recognition software and may include unintentional dictation  errors.    Victorino Dike, FNP 12/22/16 [place], MD 12/23/16 [phone]

## 2016-12-22 NOTE — ED Notes (Signed)

## 2016-12-22 NOTE — Discharge Instructions (Signed)
Return to the emergency department for fever, nausea, vomiting, or back pain.  Call tomorrow to schedule an appointment with urology.  You are unable to see urology, schedule a follow-up appointment with your primary care provider.

## 2016-12-23 ENCOUNTER — Ambulatory Visit
Admission: RE | Admit: 2016-12-23 | Discharge: 2016-12-23 | Disposition: A | Discharge status: Home / Self Care | Payer: Medicare Other | Source: Ambulatory Visit | Attending: Internal Medicine | Admitting: Internal Medicine

## 2016-12-23 DIAGNOSIS — R918 Other nonspecific abnormal finding of lung field: Secondary | ICD-10-CM | POA: Diagnosis not present

## 2016-12-28 ENCOUNTER — Other Ambulatory Visit
Admission: RE | Admit: 2016-12-28 | Discharge: 2016-12-28 | Disposition: A | Discharge status: Home / Self Care | Payer: Medicare Other | Source: Ambulatory Visit | Attending: Nurse Practitioner | Admitting: Nurse Practitioner

## 2016-12-28 DIAGNOSIS — R197 Diarrhea, unspecified: Secondary | ICD-10-CM | POA: Diagnosis present

## 2016-12-28 LAB — C DIFFICILE QUICK SCREEN W PCR REFLEX
C Diff antigen: NEGATIVE
C Diff interpretation: NOT DETECTED
C Diff toxin: NEGATIVE

## 2017-01-19 ENCOUNTER — Ambulatory Visit: Payer: Medicare Other

## 2017-01-19 ENCOUNTER — Ambulatory Visit: Payer: Medicare Other | Admitting: Urology

## 2017-01-27 ENCOUNTER — Encounter: Payer: Self-pay | Admitting: Urology

## 2017-01-27 ENCOUNTER — Ambulatory Visit (INDEPENDENT_AMBULATORY_CARE_PROVIDER_SITE_OTHER): Payer: Medicare Other | Admitting: Urology

## 2017-01-27 VITALS — BP 136/83 | HR 69 | Ht 67.0 in | Wt 170.0 lb

## 2017-01-27 DIAGNOSIS — N3281 Overactive bladder: Secondary | ICD-10-CM

## 2017-01-27 DIAGNOSIS — R3129 Other microscopic hematuria: Secondary | ICD-10-CM

## 2017-01-27 DIAGNOSIS — R32 Unspecified urinary incontinence: Secondary | ICD-10-CM

## 2017-01-27 LAB — BLADDER SCAN AMB NON-IMAGING: SCAN RESULT: 45

## 2017-01-27 MED ORDER — MIRABEGRON ER 25 MG PO TB24: 25.0000 mg | ORAL_TABLET | Freq: Every day | ORAL | 11 refills | Status: DC

## 2017-01-27 NOTE — Progress Notes (Signed)
01/27/2017 1:29 PM   Rebecca Escobar 09-Feb-1961 [id number]  Referring provider: Ellamae Sia, MD [address] [place], [place] [address]  Chief Complaint  Patient presents with  . Follow-up    urinary incontinence     HPI: The patient is a 56 year old female presents for follow-up with dysuria and urinary incontinence.  1. Urinary incontinence The patient notes a few month history of worsening urgency with urge incontinence. This does not happen constantly. It happens approximately one to 3 times per week. She is very bothered by this is new for her. She finds it embarrassing and uncomfortable. She also notes that she gets this urge to go she has is painful spasms below her umbilicus. She finds this very uncomfortable. They radiate to her urethra. She has had multiple doses of antibiotics which has not helped with her symptoms. She is very bothered by this. She does have minor incontinence with coughing or sneezing which she does not find this bothersome. She has nocturia 2. She voids aprroximately 6 times during the day. She feels that she empties her bladder. She has a good stream. Her PVR today is 36 cc.  In review of her chart, she presented to the emergency department in early generally 2018 for recurrent dysuria, urinary frequency, and urinary incontinence. She is being treated by her primary care provider with Cipro for urinary tract infection. This was eventually changed to Bactrim. She had no improvement or in her symptoms, so she presented to the emergency department. Urinalysis at that time was not concerning for infection. She has had multiple urine cultures available to me over the last year. All of this has been negative dating back until early 2017 when she does have 1 positive for Escherichia coli.  She does have a history of Crohn's disease which is actively being treated.  2. Microscopic hematuria -negative workup in September 2017.     PMH: Past  Medical History:  Diagnosis Date  . Anemia   . Anxiety   . Cervical cancer ([place])   . Crohn's disease ([place])   . GERD (gastroesophageal reflux disease)   . Tuberculosis    treated    Surgical History: Past Surgical History:  Procedure Laterality Date  . ABDOMINAL HYSTERECTOMY    . CESAREAN SECTION    . COLON SURGERY    . COLONOSCOPY WITH PROPOFOL N/A 03/02/2016   Procedure: COLONOSCOPY WITH PROPOFOL;  Surgeon: Manya Silvas, MD;  Location: [facility] ENDOSCOPY;  Service: Endoscopy;  Laterality: N/A;    Home Medications:  Allergies as of 01/27/2017      Reactions   Nsaids    Due to Crohns disease      Medication List       Accurate as of 01/27/17  1:29 PM. Always use your most recent med list.          Certolizumab Pegol 2 X 200 MG/ML Kit Inject 400 mg into the skin every 30 (thirty) days.   cyanocobalamin 1000 MCG tablet Inject 1,000 mcg as directed every 30 (thirty) days.   cyanocobalamin 1000 MCG/ML injection Commonly known as:  (VITAMIN B-12) Inject into the muscle.   HYDROcodone-acetaminophen 5-325 MG tablet Commonly known as:  NORCO/VICODIN Take 1 tablet by mouth every 4 (four) hours as needed.   meclizine 12.5 MG tablet Commonly known as:  ANTIVERT Take 12.5 mg by mouth 3 (three) times daily as needed for dizziness.   mirabegron ER 25 MG Tb24 tablet Commonly known as:  MYRBETRIQ  Take 1 tablet (25 mg total) by mouth daily.   multivitamin tablet Take 1 tablet by mouth daily.   MULTI-VITAMINS Tabs Take by mouth.   omeprazole 20 MG capsule Commonly known as:  PRILOSEC Take 20 mg by mouth daily.   phenazopyridine 200 MG tablet Commonly known as:  PYRIDIUM Take 1 tablet (200 mg total) by mouth 3 (three) times daily as needed for pain.   predniSONE 10 MG (21) Tbpk tablet Commonly known as:  STERAPRED UNI-PAK 21 TAB Take 6 tablets on day 1 Take 5 tablets on day 2 Take 4 tablets on day 3 Take 3 tablets on day 4 Take 2 tablets on day 5 Take 1 tablet on day  6   traMADol 50 MG tablet Commonly known as:  ULTRAM Take by mouth.       Allergies:  Allergies  Allergen Reactions  . Nsaids     Due to Crohns disease    Family History: Family History  Problem Relation Age of Onset  . Kidney disease Neg Hx   . Bladder Cancer Neg Hx   . Breast cancer Neg Hx     Social History:  reports that she has been smoking Cigarettes.  She has been smoking about 0.50 packs per day. She has never used smokeless tobacco. She reports that she does not drink alcohol or use drugs.  ROS: UROLOGY Frequent Urination?: Yes Hard to postpone urination?: No Burning/pain with urination?: No Get up at night to urinate?: No Leakage of urine?: Yes Urine stream starts and stops?: No Trouble starting stream?: No Do you have to strain to urinate?: No Blood in urine?: No Urinary tract infection?: No Sexually transmitted disease?: No Injury to kidneys or bladder?: No Painful intercourse?: No Weak stream?: No Currently pregnant?: No Vaginal bleeding?: No Last menstrual period?: n  Gastrointestinal Nausea?: No Vomiting?: No Indigestion/heartburn?: No Diarrhea?: No Constipation?: No  Constitutional Fever: No Night sweats?: No Weight loss?: No Fatigue?: No  Skin Skin rash/lesions?: No Itching?: No  Eyes Blurred vision?: No Double vision?: No  Ears/Nose/Throat Sore throat?: No Sinus problems?: No  Hematologic/Lymphatic Swollen glands?: No Easy bruising?: No  Cardiovascular Leg swelling?: No Chest pain?: No  Respiratory Cough?: No Shortness of breath?: No  Endocrine Excessive thirst?: No  Musculoskeletal Back pain?: No Joint pain?: No  Neurological Headaches?: No Dizziness?: No  Psychologic Depression?: No Anxiety?: No  Physical Exam: BP 136/83   Pulse 69   Ht 5' 7"  (1.702 m)   Wt 170 lb (77.1 kg) Comment: pt reported  BMI 26.63 kg/m   Constitutional:  Alert and oriented, No acute distress. HEENT: [place] AT, moist mucus  membranes.  Trachea midline, no masses. Cardiovascular: No clubbing, cyanosis, or edema. Respiratory: Normal respiratory effort, no increased work of breathing. GI: Abdomen is soft, nontender, nondistended, no abdominal masses GU: No CVA tenderness.  Skin: No rashes, bruises or suspicious lesions. Lymph: No cervical or inguinal adenopathy. Neurologic: Grossly intact, no focal deficits, moving all 4 extremities. Psychiatric: Normal mood and affect.  Laboratory Data: Lab Results  Component Value Date   WBC 13.1 (H) 04/29/2016   HGB 13.0 04/29/2016   HCT 38.0 04/29/2016   MCV 95.3 04/29/2016   PLT 197 04/29/2016    Lab Results  Component Value Date   CREATININE 0.70 08/08/2016    No results found for: PSA  No results found for: TESTOSTERONE  No results found for: HGBA1C  Urinalysis    Component Value Date/Time   COLORURINE YELLOW (A) 12/22/2016  2136   APPEARANCEUR CLEAR (A) 12/22/2016 2136   APPEARANCEUR Clear 09/01/2016 1054   LABSPEC 1.021 12/22/2016 2136   LABSPEC 1.026 05/20/2012 0028   PHURINE 5.0 12/22/2016 2136   GLUCOSEU NEGATIVE 12/22/2016 2136   GLUCOSEU Negative 05/20/2012 0028   HGBUR NEGATIVE 12/22/2016 2136   BILIRUBINUR NEGATIVE 12/22/2016 2136   BILIRUBINUR Negative 09/01/2016 1054   BILIRUBINUR Negative 05/20/2012 0028   KETONESUR NEGATIVE 12/22/2016 2136   PROTEINUR NEGATIVE 12/22/2016 2136   NITRITE NEGATIVE 12/22/2016 2136   LEUKOCYTESUR SMALL (A) 12/22/2016 2136   LEUKOCYTESUR Negative 09/01/2016 1054   LEUKOCYTESUR 1+ 05/20/2012 0028     Assessment & Plan:   1. Microscopic hematuria -negative work up in September 2017  2. Overactive bladder The patient's symptoms today are most consistent with an overactive bladder. We'll start her on a trial of her Bactrim 25 mg daily. She is given samples and prescription was called in. If this medication is too expensive, we can try an anticholinergic. She'll follow-up in 3 months assess her  progress.  Return in about 3 months (around 04/26/2017).  Nickie Retort, MD  [facility] [address], [place], [place] [address] [phone]

## 2017-03-30 ENCOUNTER — Other Ambulatory Visit: Payer: Self-pay | Admitting: Internal Medicine

## 2017-03-30 DIAGNOSIS — R918 Other nonspecific abnormal finding of lung field: Secondary | ICD-10-CM

## 2017-04-11 ENCOUNTER — Ambulatory Visit
Admission: RE | Admit: 2017-04-11 | Discharge: 2017-04-11 | Disposition: A | Discharge status: Home / Self Care | Payer: Medicare Other | Source: Ambulatory Visit | Attending: Internal Medicine | Admitting: Internal Medicine

## 2017-04-11 DIAGNOSIS — R918 Other nonspecific abnormal finding of lung field: Secondary | ICD-10-CM | POA: Diagnosis present

## 2017-08-09 ENCOUNTER — Other Ambulatory Visit: Payer: Self-pay | Admitting: Internal Medicine

## 2017-08-09 DIAGNOSIS — J449 Chronic obstructive pulmonary disease, unspecified: Secondary | ICD-10-CM

## 2017-08-15 ENCOUNTER — Ambulatory Visit: Payer: Medicare Other | Attending: Internal Medicine

## 2017-08-15 DIAGNOSIS — J449 Chronic obstructive pulmonary disease, unspecified: Secondary | ICD-10-CM | POA: Insufficient documentation

## 2017-08-22 DIAGNOSIS — K5090 Crohn's disease, unspecified, without complications: Secondary | ICD-10-CM | POA: Insufficient documentation

## 2017-09-01 ENCOUNTER — Ambulatory Visit: Payer: Medicare Other

## 2017-11-23 ENCOUNTER — Other Ambulatory Visit: Payer: Self-pay | Admitting: Internal Medicine

## 2017-11-23 DIAGNOSIS — Z1231 Encounter for screening mammogram for malignant neoplasm of breast: Secondary | ICD-10-CM

## 2018-01-28 ENCOUNTER — Other Ambulatory Visit: Payer: Self-pay

## 2018-01-28 ENCOUNTER — Emergency Department
Admission: EM | Admit: 2018-01-28 | Discharge: 2018-01-28 | Disposition: A | Discharge status: Home / Self Care | Payer: Medicare Other | Attending: Emergency Medicine | Admitting: Emergency Medicine

## 2018-01-28 DIAGNOSIS — J40 Bronchitis, not specified as acute or chronic: Secondary | ICD-10-CM

## 2018-01-28 DIAGNOSIS — F17210 Nicotine dependence, cigarettes, uncomplicated: Secondary | ICD-10-CM | POA: Diagnosis not present

## 2018-01-28 DIAGNOSIS — Z79899 Other long term (current) drug therapy: Secondary | ICD-10-CM | POA: Diagnosis not present

## 2018-01-28 DIAGNOSIS — R05 Cough: Secondary | ICD-10-CM | POA: Diagnosis present

## 2018-01-28 MED ORDER — ALBUTEROL SULFATE HFA 108 (90 BASE) MCG/ACT IN AERS: 2.0000 | INHALATION_SPRAY | Freq: Four times a day (QID) | RESPIRATORY_TRACT | 0 refills | Status: AC | PRN

## 2018-01-28 MED ORDER — AZITHROMYCIN 250 MG PO TABS: ORAL_TABLET | ORAL | 0 refills | Status: DC

## 2018-01-28 MED ORDER — IPRATROPIUM-ALBUTEROL 0.5-2.5 (3) MG/3ML IN SOLN
3.0000 mL | Freq: Once | RESPIRATORY_TRACT | Status: AC
  Administered 2018-01-28: 3 mL via RESPIRATORY_TRACT
  Filled 2018-01-28: qty 3

## 2018-01-28 MED ORDER — PREDNISONE 10 MG PO TABS: ORAL_TABLET | ORAL | 0 refills | Status: DC

## 2018-01-28 NOTE — ED Triage Notes (Signed)
Pt c/o cough with congestion for the past week and can not get any relief with OTC meds.

## 2018-01-28 NOTE — ED Provider Notes (Signed)
[facility] Emergency Department Provider Note  ____________________________________________  Time seen: Approximately 11:47 AM  I have reviewed the triage vital signs and the nursing notes.   HISTORY  Chief Complaint URI    HPI Rebecca Escobar is a 57 y.o. female that presents to the emergency department for evaluation of nonproductive cough and chest congestion for 1 week.  Patient states that it feels like she needs to cough something up but has been unable to do so.  Cough is worse at night.  She had an episode of chills in the middle of the night at the beginning of the week but none since.  Patient is taken  Robitussin without relief.  She smokes a half a pack of cigarettes per day.  No sick contacts.  No nasal congestion, sore throat, shortness of breath, nausea, vomiting, abdominal pain.   Past Medical History:  Diagnosis Date  . Anemia   . Anxiety   . Cervical cancer ([place])   . Crohn's disease ([place])   . GERD (gastroesophageal reflux disease)   . Tuberculosis    treated    There are no active problems to display for this patient.   Past Surgical History:  Procedure Laterality Date  . ABDOMINAL HYSTERECTOMY    . CESAREAN SECTION    . COLON SURGERY    . COLONOSCOPY WITH PROPOFOL N/A 03/02/2016   Procedure: COLONOSCOPY WITH PROPOFOL;  Surgeon: Manya Silvas, MD;  Location: [facility] ENDOSCOPY;  Service: Endoscopy;  Laterality: N/A;    Prior to Admission medications   Medication Sig Start Date End Date Taking? Authorizing Provider  albuterol (PROVENTIL HFA;VENTOLIN HFA) 108 (90 Base) MCG/ACT inhaler Inhale 2 puffs into the lungs every 6 (six) hours as needed for wheezing or shortness of breath. 01/28/18   Laban Emperor, PA-C  azithromycin (ZITHROMAX Z-PAK) 250 MG tablet Take 2 tablets (500 mg) on  Day 1,  followed by 1 tablet (250 mg) once daily on Days 2 through 5. 01/28/18   Laban Emperor, PA-C  Certolizumab Pegol 2 X 200 MG/ML KIT Inject 400  mg into the skin every 30 (thirty) days.    [provider]  cyanocobalamin (,VITAMIN B-12,) 1000 MCG/ML injection Inject into the muscle. 05/18/16   [provider]  cyanocobalamin 1000 MCG tablet Inject 1,000 mcg as directed every 30 (thirty) days.    [provider]  HYDROcodone-acetaminophen (NORCO/VICODIN) 5-325 MG tablet Take 1 tablet by mouth every 4 (four) hours as needed. 04/29/16   Triplett, Johnette Abraham B, FNP  meclizine (ANTIVERT) 12.5 MG tablet Take 12.5 mg by mouth 3 (three) times daily as needed for dizziness.    [provider]  mirabegron ER (MYRBETRIQ) 25 MG TB24 tablet Take 1 tablet (25 mg total) by mouth daily. 01/27/17   Nickie Retort, MD  Multiple Vitamin (MULTI-VITAMINS) TABS Take by mouth.    [provider]  Multiple Vitamin (MULTIVITAMIN) tablet Take 1 tablet by mouth daily.    [provider]  omeprazole (PRILOSEC) 20 MG capsule Take 20 mg by mouth daily.    [provider]  predniSONE (DELTASONE) 10 MG tablet Take 6 tablets on day 1, take 5 tablets on day 2, take 4 tablets on day 3, take 3 tablets on day 4, take 2 tablets on day 5, take 1 tablet on day 6 01/28/18   Laban Emperor, PA-C  traMADol (ULTRAM) 50 MG tablet Take by mouth. 01/01/16   [provider]    Allergies Nsaids  Family History  Problem Relation Age of Onset  . Kidney disease Neg Hx   . Bladder Cancer Neg Hx   . Breast cancer Neg Hx     Social History Social History   Tobacco Use  . Smoking status: Current Every Day Smoker    Packs/day: 0.50    Types: Cigarettes  . Smokeless tobacco: Never Used  Substance Use Topics  . Alcohol use: No  . Drug use: No     Review of Systems  Constitutional: No fever/chills Eyes: No visual changes. No discharge. ENT: Negative for congestion and rhinorrhea. Cardiovascular: No chest pain. Respiratory: Positive for cough. No SOB. Gastrointestinal: No abdominal pain.  No nausea, no vomiting.   No diarrhea.  No constipation. Musculoskeletal: Negative for musculoskeletal pain. Skin: Negative for rash, abrasions, lacerations, ecchymosis. Neurological: Negative for headaches.   ____________________________________________   PHYSICAL EXAM:  VITAL SIGNS: ED Triage Vitals  Enc Vitals Group     BP 01/28/18 1034 135/85     Pulse Rate 01/28/18 1034 73     Resp 01/28/18 1034 18     Temp 01/28/18 1034 97.9 F (36.6 C)     Temp Source 01/28/18 1034 Oral     SpO2 01/28/18 1034 94 %     Weight 01/28/18 1027 170 lb (77.1 kg)     Height 01/28/18 1027 5' 5"  (1.651 m)     Head Circumference --      Peak Flow --      Pain Score --      Pain Loc --      Pain Edu? --      Excl. in [place]? --      Constitutional: Alert and oriented. Well appearing and in no acute distress. Eyes: Conjunctivae are normal. PERRL. EOMI. No discharge. Head: Atraumatic. ENT: No frontal and maxillary sinus tenderness.      Ears: Tympanic membranes pearly gray with good landmarks. No discharge.      Nose: No congestion/rhinnorhea.      Mouth/Throat: Mucous membranes are moist. Oropharynx non-erythematous. Tonsils not enlarged. No exudates. Uvula midline. Neck: No stridor.   Hematological/Lymphatic/Immunilogical: No cervical lymphadenopathy. Cardiovascular: Normal rate, regular rhythm.  Good peripheral circulation. Respiratory: Normal respiratory effort without tachypnea or retractions. Lungs CTAB. Good air entry to the bases with no decreased or absent breath sounds. Gastrointestinal: Bowel sounds 4 quadrants. Soft and nontender to palpation. No guarding or rigidity. No palpable masses. No distention. Musculoskeletal: Full range of motion to all extremities. No gross deformities appreciated. Neurologic:  Normal speech and language. No gross focal neurologic deficits are appreciated.  Skin:  Skin is warm, dry and intact. No rash noted. Psychiatric: Mood and affect are normal. Speech and behavior are normal.  Patient exhibits appropriate insight and judgement.   ____________________________________________   LABS (all labs ordered are listed, but only abnormal results are displayed)  Labs Reviewed - No data to display ____________________________________________  EKG   ____________________________________________  RADIOLOGY   No results found.  ____________________________________________    PROCEDURES  Procedure(s) performed:    Procedures    Medications  ipratropium-albuterol (DUONEB) 0.5-2.5 (3) MG/3ML nebulizer solution 3 mL (3 mLs Nebulization Given 01/28/18 1153)     ____________________________________________   INITIAL IMPRESSION / ASSESSMENT AND PLAN / ED COURSE  Pertinent labs & imaging results that were available during my care of the patient were reviewed by me and considered in my medical decision making (see chart for details).  Review of the [place] CSRS was performed in  accordance of the [place] prior to dispensing any controlled drugs.     Patient's diagnosis is consistent with bronchitis. Vital signs and exam are reassuring.  Patient was given DuoNeb in ED.  Patient appears well and is staying well hydrated. Patient should alternate tylenol and ibuprofen for fever. Patient feels comfortable going home. Patient will be discharged home with prescriptions for azithromycin, prednisone, albuterol inhaler. Patient is to follow up with PCP as needed or otherwise directed. Patient is given ED precautions to return to the ED for any worsening or new symptoms.     ____________________________________________  FINAL CLINICAL IMPRESSION(S) / ED DIAGNOSES  Final diagnoses:  Bronchitis      NEW MEDICATIONS STARTED DURING THIS VISIT:  ED Discharge Orders        Ordered    azithromycin (ZITHROMAX Z-PAK) 250 MG tablet     01/28/18 1210    predniSONE (DELTASONE) 10 MG tablet     01/28/18 1210    albuterol (PROVENTIL HFA;VENTOLIN HFA) 108 (90 Base) MCG/ACT  inhaler  Every 6 hours PRN     01/28/18 1210          This chart was dictated using voice recognition software/Dragon. Despite best efforts to proofread, errors can occur which can change the meaning. Any change was purely unintentional.    Laban Emperor, PA-C 01/28/18 1318    Schuyler Amor, MD 01/28/18 [phone]

## 2018-02-12 ENCOUNTER — Encounter: Payer: Self-pay | Admitting: Oncology

## 2018-02-12 ENCOUNTER — Inpatient Hospital Stay: Payer: Medicare Other

## 2018-02-12 ENCOUNTER — Inpatient Hospital Stay: Payer: Medicare Other | Attending: Oncology | Admitting: Oncology

## 2018-02-12 VITALS — BP 120/79 | HR 73 | Temp 97.3°F | Resp 18 | Ht 67.0 in | Wt 171.3 lb

## 2018-02-12 DIAGNOSIS — Z79899 Other long term (current) drug therapy: Secondary | ICD-10-CM | POA: Diagnosis not present

## 2018-02-12 DIAGNOSIS — D7589 Other specified diseases of blood and blood-forming organs: Secondary | ICD-10-CM

## 2018-02-12 DIAGNOSIS — K219 Gastro-esophageal reflux disease without esophagitis: Secondary | ICD-10-CM | POA: Diagnosis not present

## 2018-02-12 DIAGNOSIS — K5090 Crohn's disease, unspecified, without complications: Secondary | ICD-10-CM | POA: Insufficient documentation

## 2018-02-12 DIAGNOSIS — J069 Acute upper respiratory infection, unspecified: Secondary | ICD-10-CM | POA: Diagnosis not present

## 2018-02-12 DIAGNOSIS — F17210 Nicotine dependence, cigarettes, uncomplicated: Secondary | ICD-10-CM | POA: Diagnosis not present

## 2018-02-12 DIAGNOSIS — Z8541 Personal history of malignant neoplasm of cervix uteri: Secondary | ICD-10-CM | POA: Diagnosis not present

## 2018-02-12 DIAGNOSIS — F419 Anxiety disorder, unspecified: Secondary | ICD-10-CM | POA: Diagnosis not present

## 2018-02-12 LAB — CBC WITH DIFFERENTIAL/PLATELET
Basophils Absolute: 0.1 10*3/uL (ref 0–0.1)
Basophils Relative: 1 %
EOS PCT: 3 %
Eosinophils Absolute: 0.2 10*3/uL (ref 0–0.7)
HEMATOCRIT: 40 % (ref 35.0–47.0)
Hemoglobin: 13.7 g/dL (ref 12.0–16.0)
LYMPHS ABS: 2.3 10*3/uL (ref 1.0–3.6)
LYMPHS PCT: 34 %
MCH: 34.8 pg — AB (ref 26.0–34.0)
MCHC: 34.3 g/dL (ref 32.0–36.0)
MCV: 101.5 fL — AB (ref 80.0–100.0)
MONO ABS: 0.6 10*3/uL (ref 0.2–0.9)
Monocytes Relative: 8 %
Neutro Abs: 3.7 10*3/uL (ref 1.4–6.5)
Neutrophils Relative %: 54 %
PLATELETS: 201 10*3/uL (ref 150–440)
RBC: 3.94 MIL/uL (ref 3.80–5.20)
RDW: 13.6 % (ref 11.5–14.5)
WBC: 6.8 10*3/uL (ref 3.6–11.0)

## 2018-02-12 LAB — COMPREHENSIVE METABOLIC PANEL
ALBUMIN: 3.6 g/dL (ref 3.5–5.0)
ALT: 12 U/L — AB (ref 14–54)
AST: 20 U/L (ref 15–41)
Alkaline Phosphatase: 88 U/L (ref 38–126)
Anion gap: 5 (ref 5–15)
BUN: 18 mg/dL (ref 6–20)
CHLORIDE: 106 mmol/L (ref 101–111)
CO2: 28 mmol/L (ref 22–32)
CREATININE: 0.68 mg/dL (ref 0.44–1.00)
Calcium: 9 mg/dL (ref 8.9–10.3)
GFR calc Af Amer: >60 mL/min (ref >60)
GFR calc non Af Amer: >60 mL/min (ref >60)
GLUCOSE: 100 mg/dL — AB (ref 65–99)
Potassium: 4.7 mmol/L (ref 3.5–5.1)
SODIUM: 139 mmol/L (ref 135–145)
Total Bilirubin: 0.6 mg/dL (ref 0.3–1.2)
Total Protein: 7 g/dL (ref 6.5–8.1)

## 2018-02-12 LAB — RETICULOCYTES
RBC.: 4.05 MIL/uL (ref 3.80–5.20)
Retic Count, Absolute: 36.5 10*3/uL (ref 19.0–183.0)
Retic Ct Pct: 0.9 % (ref 0.4–3.1)

## 2018-02-12 LAB — VITAMIN B12: VITAMIN B 12: 597 pg/mL (ref 180–914)

## 2018-02-12 LAB — PATHOLOGIST SMEAR REVIEW

## 2018-02-12 LAB — LACTATE DEHYDROGENASE: LDH: 115 U/L (ref 98–192)

## 2018-02-12 NOTE — Progress Notes (Signed)
Hematology/Oncology Consult note [facility] [phone] [phone]   Patient Care Team: Ellamae Sia, MD as PCP - General (Internal Medicine)  REFERRING PROVIDER: [facility] CHIEF COMPLAINTS/PURPOSE OF CONSULTATION:  Evaluation of macrocytosis  HISTORY OF PRESENTING ILLNESS:  Rebecca Escobar is a  57 y.o.  female with PMH listed below who was referred to me for evaluation of macrocytosis.  Patient follows up with [facility] clinic GI group and has had labs done recently.  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  Reviewed patient's lab results in [place].  MCV has been above 100 since October 2016.  WBC count hemoglobin and platelet levels have been normal. Patient has had some basic workup done prior to coming to today's visit.  TSH was normal, folate and B12 was previously checked and was normal.  Ferritin was normal. Patient recently had an upper respiratory infection that has been started on a course of azithromycin and steroids.  She reports that prior to recent acute infection, she has been feeling well, no fatigue tired fever or chills.  No weight loss.  She has Crohn's disease and has been on certolizumab for the past 10 years.  No new medications or herbal supplementations.  No easy bruising or bleeding events.   Review of Systems  Constitutional: Negative for chills, fever, malaise/fatigue and weight loss.  HENT: Negative for ear discharge and nosebleeds.   Eyes: Negative for pain and discharge.  Respiratory: Negative for cough, hemoptysis and sputum production.   Cardiovascular: Negative for chest pain, palpitations, orthopnea and claudication.  Gastrointestinal: Positive for diarrhea. Negative for heartburn, nausea and vomiting.  Genitourinary: Negative for dysuria and urgency.  Musculoskeletal: Negative for myalgias and neck pain.  Skin: Negative for rash.  Neurological:  Negative for dizziness, tremors, sensory change and weakness.  Endo/Heme/Allergies: Does not bruise/bleed easily.  Psychiatric/Behavioral: Negative for depression, hallucinations and memory loss.    MEDICAL HISTORY:  Past Medical History:  Diagnosis Date  . Anemia   . Anxiety   . Cervical cancer ([place])   . Crohn's disease ([place])   . GERD (gastroesophageal reflux disease)   . Tuberculosis    treated    SURGICAL HISTORY: Past Surgical History:  Procedure Laterality Date  . ABDOMINAL HYSTERECTOMY    . CESAREAN SECTION    . COLON SURGERY    . COLONOSCOPY WITH PROPOFOL N/A 03/02/2016   Procedure: COLONOSCOPY WITH PROPOFOL;  Surgeon: Manya Silvas, MD;  Location: [facility] ENDOSCOPY;  Service: Endoscopy;  Laterality: N/A;    SOCIAL HISTORY: Social History   Socioeconomic History  . Marital status: Divorced    Spouse name: Not on file  . Number of children: Not on file  . Years of education: Not on file  . Highest education level: Not on file  Social Needs  . Financial resource strain: Not on file  . Food insecurity - worry: Not on file  . Food insecurity - inability: Not on file  . Transportation needs - medical: Not on file  . Transportation needs - non-medical: Not on file  Occupational History  . Not on file  Tobacco Use  . Smoking status: Current Every Day Smoker    Packs/day: 0.50    Types: Cigarettes  . Smokeless tobacco: Never Used  Substance and Sexual Activity  . Alcohol use: No  . Drug use: No  . Sexual activity: Yes  Other Topics Concern  . Not on file  Social History  Narrative  . Not on file    FAMILY HISTORY: Family History  Problem Relation Age of Onset  . Kidney disease Neg Hx   . Bladder Cancer Neg Hx   . Breast cancer Neg Hx     ALLERGIES:  is allergic to nsaids.  MEDICATIONS:  Current Outpatient Medications  Medication Sig Dispense Refill  . albuterol (PROVENTIL HFA;VENTOLIN HFA) 108 (90 Base) MCG/ACT inhaler Inhale 2 puffs into the lungs  every 6 (six) hours as needed for wheezing or shortness of breath. 1 Inhaler 0  . azithromycin (ZITHROMAX Z-PAK) 250 MG tablet Take 2 tablets (500 mg) on  Day 1,  followed by 1 tablet (250 mg) once daily on Days 2 through 5. 6 each 0  . Certolizumab Pegol 2 X 200 MG/ML KIT Inject 400 mg into the skin every 30 (thirty) days.    . cyanocobalamin (,VITAMIN B-12,) 1000 MCG/ML injection Inject into the muscle.    . cyanocobalamin 1000 MCG tablet Inject 1,000 mcg as directed every 30 (thirty) days.    Marland Kitchen HYDROcodone-acetaminophen (NORCO/VICODIN) 5-325 MG tablet Take 1 tablet by mouth every 4 (four) hours as needed. 12 tablet 0  . meclizine (ANTIVERT) 12.5 MG tablet Take 12.5 mg by mouth 3 (three) times daily as needed for dizziness.    . mirabegron ER (MYRBETRIQ) 25 MG TB24 tablet Take 1 tablet (25 mg total) by mouth daily. 30 tablet 11  . Multiple Vitamin (MULTI-VITAMINS) TABS Take by mouth.    . Multiple Vitamin (MULTIVITAMIN) tablet Take 1 tablet by mouth daily.    Marland Kitchen omeprazole (PRILOSEC) 20 MG capsule Take 20 mg by mouth daily.    . predniSONE (DELTASONE) 10 MG tablet Take 6 tablets on day 1, take 5 tablets on day 2, take 4 tablets on day 3, take 3 tablets on day 4, take 2 tablets on day 5, take 1 tablet on day 6 21 tablet 0  . traMADol (ULTRAM) 50 MG tablet Take by mouth.     No current facility-administered medications for this visit.      PHYSICAL EXAMINATION: ECOG PERFORMANCE STATUS: 0 - Asymptomatic Vitals:   02/12/18 0949  BP: 120/79  Pulse: 73  Resp: 18  Temp: (!) 97.3 F (36.3 C)  SpO2: 93%   Filed Weights   02/12/18 0949  Weight: 171 lb 4.8 oz (77.7 kg)    Physical Exam  Constitutional: She is oriented to person, place, and time. No distress.  HENT:  Head: Normocephalic and atraumatic.  Mouth/Throat: No oropharyngeal exudate.  Eyes: Conjunctivae and EOM are normal. Pupils are equal, round, and reactive to light. No scleral icterus.  Neck: Normal range of motion. Neck  supple.  Cardiovascular: Normal rate, regular rhythm and normal heart sounds.  No murmur heard. Pulmonary/Chest: Effort normal. No respiratory distress. She has no wheezes.  Abdominal: Soft. Bowel sounds are normal. She exhibits no mass. There is no tenderness.  Musculoskeletal: Normal range of motion. She exhibits no edema.  Lymphadenopathy:    She has no cervical adenopathy.  Neurological: She is alert and oriented to person, place, and time. No cranial nerve deficit.  Skin: Skin is warm and dry. No erythema.  Psychiatric: Affect and judgment normal.     LABORATORY DATA:  I have reviewed the data as listed Lab Results  Component Value Date   WBC 6.8 02/12/2018   HGB 13.7 02/12/2018   HCT 40.0 02/12/2018   MCV 101.5 (H) 02/12/2018   PLT 201 02/12/2018   No results for  input(s): NA, K, CL, CO2, GLUCOSE, BUN, CREATININE, CALCIUM, GFRNONAA, GFRAA, PROT, ALBUMIN, AST, ALT, ALKPHOS, BILITOT, BILIDIR, IBILI in the last 8760 hours.  Labs from [facility].  TSH 1.651 Ferritin 95 MMA 152 11/01/2017 B12 546 01/08/2018 Homocysteine 20.5 01/17/2018 Folate 5.9   ASSESSMENT & PLAN:  1. Macrocytosis without anemia    Previous lab work up results were discussed with patient. She had normal folate, MMA, and B12 level. B12 was checked in November. Normal thyroid function.   Will repeat CBC w diff and B12 level, , check CMP, flowcytometry, LDH, smear review, reticulocytes and copper.   If non conclusive, will obtain bone marrow aspiration for evaluation. MDS needs to be ruled out.   All questions were answered. The patient knows to call the clinic with any problems questions or concerns.  Return of visit: 2 weeks.  Thank you for this kind referral and the opportunity to participate in the care of this patient. A copy of today's note is routed to referring provider    Earlie Server, MD, PhD Hematology Oncology [facility] at [facility] Pager- [phone] 02/12/2018

## 2018-02-13 LAB — PROTEIN ELECTROPHORESIS, SERUM
A/G RATIO SPE: 1.2 (ref 0.7–1.7)
ALPHA-2-GLOBULIN: 0.7 g/dL (ref 0.4–1.0)
Albumin ELP: 3.5 g/dL (ref 2.9–4.4)
Alpha-1-Globulin: 0.2 g/dL (ref 0.0–0.4)
Beta Globulin: 1.1 g/dL (ref 0.7–1.3)
GLOBULIN, TOTAL: 2.9 g/dL (ref 2.2–3.9)
Gamma Globulin: 0.9 g/dL (ref 0.4–1.8)
Total Protein ELP: 6.4 g/dL (ref 6.0–8.5)

## 2018-02-14 LAB — COMP PANEL: LEUKEMIA/LYMPHOMA

## 2018-02-14 LAB — COPPER, SERUM: Copper: 102 ug/dL (ref 72–166)

## 2018-02-19 ENCOUNTER — Ambulatory Visit
Admission: RE | Admit: 2018-02-19 | Discharge: 2018-02-19 | Disposition: A | Discharge status: Home / Self Care | Payer: Medicare Other | Source: Ambulatory Visit | Attending: Internal Medicine | Admitting: Internal Medicine

## 2018-02-19 DIAGNOSIS — Z1231 Encounter for screening mammogram for malignant neoplasm of breast: Secondary | ICD-10-CM

## 2018-02-26 ENCOUNTER — Inpatient Hospital Stay: Payer: Medicare Other | Attending: Oncology | Admitting: Oncology

## 2018-02-26 ENCOUNTER — Encounter: Payer: Self-pay | Admitting: Oncology

## 2018-02-26 VITALS — BP 150/87 | HR 68 | Temp 97.6°F | Wt 174.1 lb

## 2018-02-26 DIAGNOSIS — F17210 Nicotine dependence, cigarettes, uncomplicated: Secondary | ICD-10-CM | POA: Diagnosis not present

## 2018-02-26 DIAGNOSIS — K5090 Crohn's disease, unspecified, without complications: Secondary | ICD-10-CM | POA: Diagnosis not present

## 2018-02-26 DIAGNOSIS — Z8541 Personal history of malignant neoplasm of cervix uteri: Secondary | ICD-10-CM | POA: Insufficient documentation

## 2018-02-26 DIAGNOSIS — F419 Anxiety disorder, unspecified: Secondary | ICD-10-CM | POA: Insufficient documentation

## 2018-02-26 DIAGNOSIS — D7589 Other specified diseases of blood and blood-forming organs: Secondary | ICD-10-CM | POA: Diagnosis not present

## 2018-02-26 DIAGNOSIS — K219 Gastro-esophageal reflux disease without esophagitis: Secondary | ICD-10-CM | POA: Insufficient documentation

## 2018-02-26 DIAGNOSIS — Z79899 Other long term (current) drug therapy: Secondary | ICD-10-CM | POA: Diagnosis not present

## 2018-02-26 NOTE — Progress Notes (Signed)
Patient here today for follow up.  Patient states no new concerns today  

## 2018-02-26 NOTE — Progress Notes (Signed)
Hematology/Oncology Follow up note [facility] [phone] [phone]   Patient Care Team: Ellamae Sia, MD as PCP - General (Internal Medicine)  REFERRING PROVIDER: [facility] Reason for Visit:  Evaluation of macrocytosis  HISTORY OF PRESENTING ILLNESS:  Rebecca Escobar is a  57 y.o.  female with PMH listed below who was referred to me for evaluation of macrocytosis.  Patient follows up with [facility] clinic GI group and has had labs done recently.  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  Reviewed patient's lab results in [place].  MCV has been above 100 since October 2016.  WBC count hemoglobin and platelet levels have been normal. Patient has had some basic workup done prior to coming to today's visit.  TSH was normal, folate and B12 was previously checked and was normal.  Ferritin was normal. Patient recently had an upper respiratory infection that has been started on a course of azithromycin and steroids.  She reports that prior to recent acute infection, she has been feeling well, no fatigue tired fever or chills.  No weight loss.  She has Crohn's disease and has been on certolizumab for the past 10 years.  No new medications or herbal supplementations.  No easy bruising or bleeding events. INTERVAL HISTORY Rebecca Escobar is a 57 y.o. female who has above history reviewed by me today presents for follow up visit for management of macrocytosis Patient has not complaints. She is here to discuss lab results and future management plan.   Review of Systems  Constitutional: Negative for chills, fever, malaise/fatigue and weight loss.  HENT: Negative for ear discharge and nosebleeds.   Eyes: Negative for pain and discharge.  Respiratory: Negative for cough, hemoptysis and sputum production.   Cardiovascular: Negative for chest pain, palpitations, orthopnea and claudication.    Gastrointestinal: Positive for diarrhea. Negative for heartburn, nausea and vomiting.  Genitourinary: Negative for dysuria and frequency.  Musculoskeletal: Negative for myalgias and neck pain.  Skin: Negative for rash.  Neurological: Negative for dizziness, tremors, sensory change and weakness.  Endo/Heme/Allergies: Does not bruise/bleed easily.  Psychiatric/Behavioral: Negative for depression, hallucinations and memory loss.    MEDICAL HISTORY:  Past Medical History:  Diagnosis Date  . Anemia   . Anxiety   . Cervical cancer ([place])   . Crohn's disease ([place])   . GERD (gastroesophageal reflux disease)   . Tuberculosis    treated    SURGICAL HISTORY: Past Surgical History:  Procedure Laterality Date  . ABDOMINAL HYSTERECTOMY    . CESAREAN SECTION    . COLON SURGERY    . COLONOSCOPY WITH PROPOFOL N/A 03/02/2016   Procedure: COLONOSCOPY WITH PROPOFOL;  Surgeon: Manya Silvas, MD;  Location: [facility] ENDOSCOPY;  Service: Endoscopy;  Laterality: N/A;    SOCIAL HISTORY: Social History   Socioeconomic History  . Marital status: Divorced    Spouse name: Not on file  . Number of children: Not on file  . Years of education: Not on file  . Highest education level: Not on file  Social Needs  . Financial resource strain: Not on file  . Food insecurity - worry: Not on file  . Food insecurity - inability: Not on file  . Transportation needs - medical: Not on file  . Transportation needs - non-medical: Not on file  Occupational History  . Not on file  Tobacco Use  . Smoking status: Current Every Day Smoker    Packs/day: 0.50  Types: Cigarettes  . Smokeless tobacco: Never Used  Substance and Sexual Activity  . Alcohol use: No  . Drug use: No  . Sexual activity: Yes  Other Topics Concern  . Not on file  Social History Narrative  . Not on file    FAMILY HISTORY: Family History  Problem Relation Age of Onset  . Kidney disease Neg Hx   . Bladder Cancer Neg Hx   . Breast  cancer Neg Hx     ALLERGIES:  is allergic to nsaids.  MEDICATIONS:  Current Outpatient Medications  Medication Sig Dispense Refill  . albuterol (PROVENTIL HFA;VENTOLIN HFA) 108 (90 Base) MCG/ACT inhaler Inhale 2 puffs into the lungs every 6 (six) hours as needed for wheezing or shortness of breath. 1 Inhaler 0  . Certolizumab Pegol 2 X 200 MG/ML KIT Inject 400 mg into the skin every 30 (thirty) days.    . cyanocobalamin (,VITAMIN B-12,) 1000 MCG/ML injection Inject into the muscle.    . cyanocobalamin 1000 MCG tablet Inject 1,000 mcg as directed every 30 (thirty) days.    Marland Kitchen HYDROcodone-acetaminophen (NORCO/VICODIN) 5-325 MG tablet Take 1 tablet by mouth every 4 (four) hours as needed. 12 tablet 0  . meclizine (ANTIVERT) 12.5 MG tablet Take 12.5 mg by mouth 3 (three) times daily as needed for dizziness.    . mirabegron ER (MYRBETRIQ) 25 MG TB24 tablet Take 1 tablet (25 mg total) by mouth daily. 30 tablet 11  . Multiple Vitamin (MULTI-VITAMINS) TABS Take by mouth.    . Multiple Vitamin (MULTIVITAMIN) tablet Take 1 tablet by mouth daily.    Marland Kitchen omeprazole (PRILOSEC) 20 MG capsule Take 20 mg by mouth daily.    . traMADol (ULTRAM) 50 MG tablet Take by mouth.     No current facility-administered medications for this visit.      PHYSICAL EXAMINATION: ECOG PERFORMANCE STATUS: 0 - Asymptomatic Vitals:   02/26/18 1117  BP: (!) 150/87  Pulse: 68  Temp: 97.6 F (36.4 C)   Filed Weights   02/26/18 1117  Weight: 174 lb 2 oz (79 kg)    Physical Exam  Constitutional: She is oriented to person, place, and time. No distress.  HENT:  Head: Normocephalic and atraumatic.  Mouth/Throat: No oropharyngeal exudate.  Eyes: Conjunctivae and EOM are normal. Pupils are equal, round, and reactive to light. No scleral icterus.  Neck: Normal range of motion. Neck supple.  Cardiovascular: Normal rate, regular rhythm and normal heart sounds.  No murmur heard. Pulmonary/Chest: Effort normal. No respiratory  distress. She has no wheezes.  Abdominal: Soft. Bowel sounds are normal. She exhibits no mass. There is no tenderness.  Musculoskeletal: Normal range of motion. She exhibits no edema.  Lymphadenopathy:    She has no cervical adenopathy.  Neurological: She is alert and oriented to person, place, and time. No cranial nerve deficit.  Skin: Skin is warm and dry. No erythema.  Psychiatric: Affect and judgment normal.     LABORATORY DATA:  I have reviewed the data as listed Lab Results  Component Value Date   WBC 6.8 02/12/2018   HGB 13.7 02/12/2018   HCT 40.0 02/12/2018   MCV 101.5 (H) 02/12/2018   PLT 201 02/12/2018   Recent Labs    02/12/18 1020  NA 139  K 4.7  CL 106  CO2 28  GLUCOSE 100*  BUN 18  CREATININE 0.68  CALCIUM 9.0  GFRNONAA >60  GFRAA >60  PROT 7.0  ALBUMIN 3.6  AST 20  ALT 12*  ALKPHOS 88  BILITOT 0.6    Labs from [facility].  TSH 1.651 Ferritin 95 MMA 152 11/01/2017 B12 546 01/08/2018 Homocysteine 20.5 01/17/2018 Folate 5.9   ASSESSMENT & PLAN:  1. Macrocytosis without anemia    Lab work up results were discussed with patient.   She has normal copper,  B12 level, , negative flowcytometry, normal LDH, no immature and morphological abnormality on smear review,  Discussed about bone marrow aspiration for evaluation. possibility of MDS needs to be ruled out. Patient prefers to monitor counts and if do bone marrow biopsy if she starts to have cytopenia. I think this is reasonable given her very mild macrocytosis without any cytopenia.   All questions were answered. The patient knows to call the clinic with any problems questions or concerns.  Return of visit: 6 months with repeat cbc Thank you for this kind referral and the opportunity to participate in the care of this patient. A copy of today's note is routed to referring provider    Earlie Server, MD, PhD Hematology Oncology [facility] at [facility] Pager-  [phone] 02/26/2018

## 2018-03-07 ENCOUNTER — Ambulatory Visit: Payer: Medicare Other | Admitting: Oncology

## 2018-04-05 ENCOUNTER — Other Ambulatory Visit: Payer: Self-pay | Admitting: Internal Medicine

## 2018-04-05 DIAGNOSIS — R918 Other nonspecific abnormal finding of lung field: Secondary | ICD-10-CM

## 2018-04-11 ENCOUNTER — Ambulatory Visit
Admission: RE | Admit: 2018-04-11 | Discharge: 2018-04-11 | Disposition: A | Discharge status: Home / Self Care | Payer: Medicare Other | Source: Ambulatory Visit | Attending: Internal Medicine | Admitting: Internal Medicine

## 2018-04-11 DIAGNOSIS — R918 Other nonspecific abnormal finding of lung field: Secondary | ICD-10-CM

## 2018-04-11 DIAGNOSIS — I700 Atherosclerosis of aorta: Secondary | ICD-10-CM | POA: Insufficient documentation

## 2018-04-11 DIAGNOSIS — J439 Emphysema, unspecified: Secondary | ICD-10-CM | POA: Insufficient documentation

## 2018-04-11 DIAGNOSIS — R911 Solitary pulmonary nodule: Secondary | ICD-10-CM | POA: Diagnosis not present

## 2018-08-30 ENCOUNTER — Encounter: Payer: Self-pay | Admitting: Oncology

## 2018-08-30 ENCOUNTER — Inpatient Hospital Stay: Payer: Medicare Other

## 2018-08-30 ENCOUNTER — Inpatient Hospital Stay: Payer: Medicare Other | Attending: Oncology | Admitting: Oncology

## 2018-08-30 ENCOUNTER — Other Ambulatory Visit: Payer: Self-pay

## 2018-08-30 VITALS — BP 118/74 | HR 88 | Temp 96.4°F | Resp 18 | Wt 173.5 lb

## 2018-08-30 DIAGNOSIS — K5090 Crohn's disease, unspecified, without complications: Secondary | ICD-10-CM | POA: Insufficient documentation

## 2018-08-30 DIAGNOSIS — D7589 Other specified diseases of blood and blood-forming organs: Secondary | ICD-10-CM

## 2018-08-30 DIAGNOSIS — D72820 Lymphocytosis (symptomatic): Secondary | ICD-10-CM | POA: Diagnosis not present

## 2018-08-30 DIAGNOSIS — K50919 Crohn's disease, unspecified, with unspecified complications: Secondary | ICD-10-CM

## 2018-08-30 DIAGNOSIS — F17210 Nicotine dependence, cigarettes, uncomplicated: Secondary | ICD-10-CM

## 2018-08-30 LAB — CBC WITH DIFFERENTIAL/PLATELET
Basophils Absolute: 0.1 10*3/uL (ref 0–0.1)
Basophils Relative: 1 %
EOS PCT: 3 %
Eosinophils Absolute: 0.3 10*3/uL (ref 0–0.7)
HCT: 39.5 % (ref 35.0–47.0)
HEMOGLOBIN: 13.4 g/dL (ref 12.0–16.0)
LYMPHS ABS: 4.5 10*3/uL — AB (ref 1.0–3.6)
LYMPHS PCT: 46 %
MCH: 34.8 pg — AB (ref 26.0–34.0)
MCHC: 33.9 g/dL (ref 32.0–36.0)
MCV: 102.8 fL — ABNORMAL HIGH (ref 80.0–100.0)
Monocytes Absolute: 0.6 10*3/uL (ref 0.2–0.9)
Monocytes Relative: 7 %
Neutro Abs: 4.1 10*3/uL (ref 1.4–6.5)
Neutrophils Relative %: 43 %
PLATELETS: 242 10*3/uL (ref 150–440)
RBC: 3.84 MIL/uL (ref 3.80–5.20)
RDW: 13.3 % (ref 11.5–14.5)
WBC: 9.6 10*3/uL (ref 3.6–11.0)

## 2018-08-30 NOTE — Progress Notes (Signed)
Patient here for follow up. No concerns voiced.  °

## 2018-08-30 NOTE — Progress Notes (Signed)
Hematology/Oncology Follow up note [facility] [phone] [phone]   Patient Care Team: Ellamae Sia, MD as PCP - General (Internal Medicine)  REFERRING PROVIDER: [facility] Reason for Visit:  Evaluation of macrocytosis  HISTORY OF PRESENTING ILLNESS:  Rebecca Escobar is a  57 y.o.  female with PMH listed below who was referred to me for evaluation of macrocytosis.  Patient follows up with [facility] clinic GI group and has had labs done recently.  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  Reviewed patient's lab results in [place].  MCV has been above 100 since October 2016.  WBC count hemoglobin and platelet levels have been normal. Patient has had some basic workup done prior to coming to today's visit.  TSH was normal, folate and B12 was previously checked and was normal.  Ferritin was normal. Patient recently had an upper respiratory infection that has been started on a course of azithromycin and steroids.  She reports that prior to recent acute infection, she has been feeling well, no fatigue tired fever or chills.  No weight loss.  She has Crohn's disease and has been on certolizumab for the past 10 years.  No new medications or herbal supplementations.  No easy bruising or bleeding events. INTERVAL HISTORY Rebecca Escobar is a 57 y.o. female who has above history reviewed by me today presents for follow up visit for macrocytosis.  Patient reports doing well at baseline. She has no new complaint.  Crohn's disease,  on Certolizumab.  Denies weight loss, fever, chills, fatigue, night sweats.    Review of Systems  Constitutional: Negative for chills, fever, malaise/fatigue and weight loss.  HENT: Negative for ear discharge and nosebleeds.   Eyes: Negative for pain and discharge.  Respiratory: Negative for cough, hemoptysis and sputum production.   Cardiovascular: Negative for  chest pain, palpitations, orthopnea and claudication.  Gastrointestinal: Positive for diarrhea. Negative for heartburn, nausea and vomiting.  Genitourinary: Negative for dysuria and frequency.  Musculoskeletal: Negative for myalgias and neck pain.  Skin: Negative for rash.  Neurological: Negative for dizziness, tremors, sensory change and weakness.  Endo/Heme/Allergies: Does not bruise/bleed easily.  Psychiatric/Behavioral: Negative for depression, hallucinations and memory loss.    MEDICAL HISTORY:  Past Medical History:  Diagnosis Date  . Anemia   . Anxiety   . Cervical cancer ([place])   . Crohn's disease ([place])   . GERD (gastroesophageal reflux disease)   . Tuberculosis    treated    SURGICAL HISTORY: Past Surgical History:  Procedure Laterality Date  . ABDOMINAL HYSTERECTOMY    . CESAREAN SECTION    . COLON SURGERY    . COLONOSCOPY WITH PROPOFOL N/A 03/02/2016   Procedure: COLONOSCOPY WITH PROPOFOL;  Surgeon: Manya Silvas, MD;  Location: [facility] ENDOSCOPY;  Service: Endoscopy;  Laterality: N/A;    SOCIAL HISTORY: Social History   Socioeconomic History  . Marital status: Divorced    Spouse name: Not on file  . Number of children: Not on file  . Years of education: Not on file  . Highest education level: Not on file  Occupational History  . Not on file  Social Needs  . Financial resource strain: Not on file  . Food insecurity:    Worry: Not on file    Inability: Not on file  . Transportation needs:    Medical: Not on file    Non-medical: Not on file  Tobacco Use  . Smoking  status: Current Every Day Smoker    Packs/day: 0.50    Types: Cigarettes  . Smokeless tobacco: Never Used  Substance and Sexual Activity  . Alcohol use: No  . Drug use: No  . Sexual activity: Yes  Lifestyle  . Physical activity:    Days per week: Not on file    Minutes per session: Not on file  . Stress: Not on file  Relationships  . Social connections:    Talks on phone: Not on file     Gets together: Not on file    Attends religious service: Not on file    Active member of club or organization: Not on file    Attends meetings of clubs or organizations: Not on file    Relationship status: Not on file  . Intimate partner violence:    Fear of current or ex partner: Not on file    Emotionally abused: Not on file    Physically abused: Not on file    Forced sexual activity: Not on file  Other Topics Concern  . Not on file  Social History Narrative  . Not on file    FAMILY HISTORY: Family History  Problem Relation Age of Onset  . Kidney disease Neg Hx   . Bladder Cancer Neg Hx   . Breast cancer Neg Hx     ALLERGIES:  is allergic to nsaids.  MEDICATIONS:  Current Outpatient Medications  Medication Sig Dispense Refill  . albuterol (PROVENTIL HFA;VENTOLIN HFA) 108 (90 Base) MCG/ACT inhaler Inhale 2 puffs into the lungs every 6 (six) hours as needed for wheezing or shortness of breath. 1 Inhaler 0  . Certolizumab Pegol 2 X 200 MG/ML KIT Inject 400 mg into the skin every 30 (thirty) days.    . cyanocobalamin (,VITAMIN B-12,) 1000 MCG/ML injection Inject into the muscle.    . meclizine (ANTIVERT) 12.5 MG tablet Take 12.5 mg by mouth 3 (three) times daily as needed for dizziness.    . Multiple Vitamin (MULTI-VITAMINS) TABS Take by mouth.    Marland Kitchen omeprazole (PRILOSEC) 20 MG capsule Take 20 mg by mouth daily.    . traMADol (ULTRAM) 50 MG tablet Take by mouth.    Marland Kitchen HYDROcodone-acetaminophen (NORCO/VICODIN) 5-325 MG tablet Take 1 tablet by mouth every 4 (four) hours as needed. (Patient not taking: Reported on 08/30/2018) 12 tablet 0  . mirabegron ER (MYRBETRIQ) 25 MG TB24 tablet Take 1 tablet (25 mg total) by mouth daily. (Patient not taking: Reported on 08/30/2018) 30 tablet 11   No current facility-administered medications for this visit.      PHYSICAL EXAMINATION: ECOG PERFORMANCE STATUS: 0 - Asymptomatic Vitals:   08/30/18 1431  BP: 118/74  Pulse: 88  Resp: 18    Temp: (!) 96.4 F (35.8 C)   Filed Weights   08/30/18 1431  Weight: 173 lb 8 oz (78.7 kg)    Physical Exam  Constitutional: She is oriented to person, place, and time and well-developed, well-nourished, and in no distress. No distress.  HENT:  Head: Normocephalic and atraumatic.  Nose: Nose normal.  Mouth/Throat: Oropharynx is clear and moist. No oropharyngeal exudate.  Eyes: Pupils are equal, round, and reactive to light. Conjunctivae and EOM are normal. Left eye exhibits no discharge. No scleral icterus.  Neck: Normal range of motion. Neck supple.  Cardiovascular: Normal rate, regular rhythm and normal heart sounds.  No murmur heard. Pulmonary/Chest: Effort normal. No respiratory distress. She has no wheezes. She has no rales. She exhibits no tenderness.  Abdominal: Soft. Bowel sounds are normal. She exhibits no distension and no mass. There is no tenderness.  Musculoskeletal: Normal range of motion. She exhibits no edema.  Lymphadenopathy:    She has no cervical adenopathy.  Neurological: She is alert and oriented to person, place, and time. No cranial nerve deficit. She exhibits normal muscle tone. Coordination normal.  Skin: Skin is warm and dry. She is not diaphoretic. No erythema.  Psychiatric: Affect and judgment normal.     LABORATORY DATA:  I have reviewed the data as listed Lab Results  Component Value Date   WBC 9.6 08/30/2018   HGB 13.4 08/30/2018   HCT 39.5 08/30/2018   MCV 102.8 (H) 08/30/2018   PLT 242 08/30/2018   Recent Labs    02/12/18 1020  NA 139  K 4.7  CL 106  CO2 28  GLUCOSE 100*  BUN 18  CREATININE 0.68  CALCIUM 9.0  GFRNONAA >60  GFRAA >60  PROT 7.0  ALBUMIN 3.6  AST 20  ALT 12*  ALKPHOS 88  BILITOT 0.6    Labs from [facility].  TSH 1.651 Ferritin 95 MMA 152 11/01/2017 B12 546 01/08/2018 Homocysteine 20.5 01/17/2018 Folate 5.9   normal copper,  B12 level, , negative flowcytometry, normal LDH, no immature and  morphological abnormality on smear review,  ASSESSMENT & PLAN:  1. Macrocytosis without anemia   2. Crohn's disease with complication, unspecified gastrointestinal tract location ([place])   3. Lymphocytosis    Labs are reviewed and discussed with patient.  Persistent mild macrocytosis.  In the context of Crohn's disease, will repeat her B12, Folate level,  # Lymphocytosis, repeat cbc in 4 weeks.  All questions were answered. The patient knows to call the clinic with any problems questions or concerns.  Return of visit: 6 months with repeat labs Total face to face encounter time for this patient visit was 15 min. >50% of the time was  spent in counseling and coordination of care.    Earlie Server, MD, PhD Hematology Oncology [facility] Pager- [phone] 08/30/2018

## 2018-09-27 ENCOUNTER — Inpatient Hospital Stay: Payer: Medicare Other | Attending: Oncology

## 2018-09-27 DIAGNOSIS — D72820 Lymphocytosis (symptomatic): Secondary | ICD-10-CM

## 2018-09-27 DIAGNOSIS — K50919 Crohn's disease, unspecified, with unspecified complications: Secondary | ICD-10-CM

## 2018-09-27 DIAGNOSIS — D7589 Other specified diseases of blood and blood-forming organs: Secondary | ICD-10-CM | POA: Diagnosis present

## 2018-09-27 DIAGNOSIS — K5090 Crohn's disease, unspecified, without complications: Secondary | ICD-10-CM | POA: Diagnosis not present

## 2018-09-27 LAB — COMPREHENSIVE METABOLIC PANEL
ALT: 9 U/L (ref 0–44)
ANION GAP: 8 (ref 5–15)
AST: 16 U/L (ref 15–41)
Albumin: 4.2 g/dL (ref 3.5–5.0)
Alkaline Phosphatase: 92 U/L (ref 38–126)
BUN: 14 mg/dL (ref 6–20)
CHLORIDE: 106 mmol/L (ref 98–111)
CO2: 27 mmol/L (ref 22–32)
CREATININE: 0.78 mg/dL (ref 0.44–1.00)
Calcium: 9.4 mg/dL (ref 8.9–10.3)
GFR calc non Af Amer: >60 mL/min (ref >60)
Glucose, Bld: 92 mg/dL (ref 70–99)
Potassium: 3.6 mmol/L (ref 3.5–5.1)
SODIUM: 141 mmol/L (ref 135–145)
Total Bilirubin: 0.7 mg/dL (ref 0.3–1.2)
Total Protein: 7.1 g/dL (ref 6.5–8.1)

## 2018-09-27 LAB — CBC WITH DIFFERENTIAL/PLATELET
ABS IMMATURE GRANULOCYTES: 0.01 10*3/uL (ref 0.00–0.07)
BASOS ABS: 0 10*3/uL (ref 0.0–0.1)
BASOS PCT: 1 %
EOS ABS: 0.1 10*3/uL (ref 0.0–0.5)
EOS PCT: 2 %
HCT: 40.5 % (ref 36.0–46.0)
Hemoglobin: 13.5 g/dL (ref 12.0–15.0)
Immature Granulocytes: 0 %
LYMPHS PCT: 47 %
Lymphs Abs: 3.9 10*3/uL (ref 0.7–4.0)
MCH: 33.9 pg (ref 26.0–34.0)
MCHC: 33.3 g/dL (ref 30.0–36.0)
MCV: 101.8 fL — AB (ref 80.0–100.0)
MONO ABS: 0.4 10*3/uL (ref 0.1–1.0)
Monocytes Relative: 5 %
Neutro Abs: 3.6 10*3/uL (ref 1.7–7.7)
Neutrophils Relative %: 45 %
PLATELETS: 190 10*3/uL (ref 150–400)
RBC: 3.98 MIL/uL (ref 3.87–5.11)
RDW: 12.6 % (ref 11.5–15.5)
WBC: 8 10*3/uL (ref 4.0–10.5)
nRBC: 0 % (ref 0.0–0.2)

## 2018-09-27 LAB — VITAMIN B12: Vitamin B-12: 553 pg/mL (ref 180–914)

## 2018-09-27 LAB — FOLATE: FOLATE: 15.8 ng/mL (ref >5.9)

## 2018-09-28 LAB — HOMOCYSTEINE: Homocysteine: 9.6 umol/L (ref 0.0–15.0)

## 2018-09-30 LAB — METHYLMALONIC ACID, SERUM: METHYLMALONIC ACID, QUANTITATIVE: 118 nmol/L (ref 0–378)

## 2019-01-01 DIAGNOSIS — E559 Vitamin D deficiency, unspecified: Secondary | ICD-10-CM | POA: Insufficient documentation

## 2019-01-01 DIAGNOSIS — M8589 Other specified disorders of bone density and structure, multiple sites: Secondary | ICD-10-CM | POA: Insufficient documentation

## 2019-02-28 ENCOUNTER — Other Ambulatory Visit: Payer: Medicare Other

## 2019-02-28 ENCOUNTER — Ambulatory Visit: Payer: Medicare Other | Admitting: Oncology

## 2019-03-01 ENCOUNTER — Ambulatory Visit: Payer: Medicare Other | Admitting: Oncology

## 2019-03-01 ENCOUNTER — Other Ambulatory Visit: Payer: Medicare Other

## 2019-03-05 ENCOUNTER — Inpatient Hospital Stay: Payer: Medicare Other | Admitting: Oncology

## 2019-03-05 ENCOUNTER — Inpatient Hospital Stay: Payer: Medicare Other

## 2019-04-01 ENCOUNTER — Other Ambulatory Visit: Payer: Self-pay

## 2019-04-01 ENCOUNTER — Inpatient Hospital Stay: Payer: Medicare Other | Attending: Oncology

## 2019-04-01 DIAGNOSIS — Z79899 Other long term (current) drug therapy: Secondary | ICD-10-CM | POA: Insufficient documentation

## 2019-04-01 DIAGNOSIS — Z8541 Personal history of malignant neoplasm of cervix uteri: Secondary | ICD-10-CM | POA: Diagnosis not present

## 2019-04-01 DIAGNOSIS — D7589 Other specified diseases of blood and blood-forming organs: Secondary | ICD-10-CM | POA: Diagnosis present

## 2019-04-01 DIAGNOSIS — Z886 Allergy status to analgesic agent status: Secondary | ICD-10-CM | POA: Diagnosis not present

## 2019-04-01 DIAGNOSIS — R911 Solitary pulmonary nodule: Secondary | ICD-10-CM | POA: Diagnosis not present

## 2019-04-01 DIAGNOSIS — F17210 Nicotine dependence, cigarettes, uncomplicated: Secondary | ICD-10-CM | POA: Diagnosis not present

## 2019-04-01 DIAGNOSIS — F4321 Adjustment disorder with depressed mood: Secondary | ICD-10-CM | POA: Diagnosis not present

## 2019-04-01 DIAGNOSIS — K219 Gastro-esophageal reflux disease without esophagitis: Secondary | ICD-10-CM | POA: Insufficient documentation

## 2019-04-01 LAB — CBC WITH DIFFERENTIAL/PLATELET
Abs Immature Granulocytes: 0.02 10*3/uL (ref 0.00–0.07)
Basophils Absolute: 0.1 10*3/uL (ref 0.0–0.1)
Basophils Relative: 1 %
Eosinophils Absolute: 0.5 10*3/uL (ref 0.0–0.5)
Eosinophils Relative: 6 %
HCT: 38.6 % (ref 36.0–46.0)
Hemoglobin: 12.9 g/dL (ref 12.0–15.0)
Immature Granulocytes: 0 %
Lymphocytes Relative: 38 %
Lymphs Abs: 3.5 10*3/uL (ref 0.7–4.0)
MCH: 34.2 pg — ABNORMAL HIGH (ref 26.0–34.0)
MCHC: 33.4 g/dL (ref 30.0–36.0)
MCV: 102.4 fL — ABNORMAL HIGH (ref 80.0–100.0)
Monocytes Absolute: 0.5 10*3/uL (ref 0.1–1.0)
Monocytes Relative: 5 %
Neutro Abs: 4.6 10*3/uL (ref 1.7–7.7)
Neutrophils Relative %: 50 %
Platelets: 178 10*3/uL (ref 150–400)
RBC: 3.77 MIL/uL — ABNORMAL LOW (ref 3.87–5.11)
RDW: 12.9 % (ref 11.5–15.5)
WBC: 9.3 10*3/uL (ref 4.0–10.5)
nRBC: 0 % (ref 0.0–0.2)

## 2019-04-01 LAB — COMPREHENSIVE METABOLIC PANEL
ALT: 10 U/L (ref 0–44)
AST: 17 U/L (ref 15–41)
Albumin: 3.9 g/dL (ref 3.5–5.0)
Alkaline Phosphatase: 80 U/L (ref 38–126)
Anion gap: 8 (ref 5–15)
BUN: 17 mg/dL (ref 6–20)
CO2: 24 mmol/L (ref 22–32)
Calcium: 8.8 mg/dL — ABNORMAL LOW (ref 8.9–10.3)
Chloride: 108 mmol/L (ref 98–111)
Creatinine, Ser: 0.82 mg/dL (ref 0.44–1.00)
GFR calc Af Amer: >60 mL/min (ref >60)
GFR calc non Af Amer: >60 mL/min (ref >60)
Glucose, Bld: 131 mg/dL — ABNORMAL HIGH (ref 70–99)
Potassium: 3.7 mmol/L (ref 3.5–5.1)
Sodium: 140 mmol/L (ref 135–145)
Total Bilirubin: 0.7 mg/dL (ref 0.3–1.2)
Total Protein: 7 g/dL (ref 6.5–8.1)

## 2019-04-01 LAB — FOLATE: Folate: 20.6 ng/mL (ref >5.9)

## 2019-04-02 ENCOUNTER — Inpatient Hospital Stay (HOSPITAL_BASED_OUTPATIENT_CLINIC_OR_DEPARTMENT_OTHER): Payer: Medicare Other | Admitting: Oncology

## 2019-04-02 ENCOUNTER — Encounter: Payer: Self-pay | Admitting: Oncology

## 2019-04-02 ENCOUNTER — Other Ambulatory Visit: Payer: Self-pay

## 2019-04-02 ENCOUNTER — Inpatient Hospital Stay: Payer: Medicare Other

## 2019-04-02 DIAGNOSIS — D7589 Other specified diseases of blood and blood-forming organs: Secondary | ICD-10-CM | POA: Diagnosis not present

## 2019-04-02 DIAGNOSIS — R911 Solitary pulmonary nodule: Secondary | ICD-10-CM

## 2019-04-02 DIAGNOSIS — F4321 Adjustment disorder with depressed mood: Secondary | ICD-10-CM | POA: Diagnosis not present

## 2019-04-02 NOTE — Progress Notes (Signed)
Called patient for WebEx visit.  Patient states no new concerns today.

## 2019-04-02 NOTE — Progress Notes (Signed)
HEMATOLOGY-ONCOLOGY TeleHEALTH VISIT PROGRESS NOTE  I connected with Rebecca Escobar on 04/02/19 at 10:00 AM EDT by video enabled telemedicine visit and verified that I am speaking with the correct person using two identifiers. I discussed the limitations, risks, security and privacy concerns of performing an evaluation and management service by telemedicine and the availability of in-person appointments. I also discussed with the patient that there may be a patient responsible charge related to this service. The patient expressed understanding and agreed to proceed.   Other persons participating in the visit and their role in the encounter:  Geraldine Solar, CMA, check in patient     Patient's location: Home  Provider's location: home  Chief Complaint: follow up for chronic macrocytosis.   INTERVAL HISTORY Rebecca Escobar is a 58 y.o. female who has above history reviewed by me today presents for follow up visit for management of chronic macrocytosis.  Problems and complaints are listed below:  She tells me that last week her dog died and she feels very sad. She lost appetite and has lost 7-8 pounds of weight. She has been doing well until recently.  No fever, chills.  Crohn's disease, on certolizumab for the past 10 years. Symptoms are well controlled.  Denies any pain today  Review of Systems  Constitutional: Negative for appetite change, chills, fatigue and fever.  HENT:   Negative for hearing loss and voice change.   Eyes: Negative for eye problems.  Respiratory: Negative for chest tightness and cough.   Cardiovascular: Negative for chest pain.  Gastrointestinal: Negative for abdominal distention, abdominal pain and blood in stool.  Endocrine: Negative for hot flashes.  Genitourinary: Negative for difficulty urinating and frequency.   Musculoskeletal: Negative for arthralgias.  Skin: Negative for itching and rash.  Neurological: Negative for extremity weakness.   Hematological: Negative for adenopathy.  Psychiatric/Behavioral: Negative for confusion.    Past Medical History:  Diagnosis Date  . Anemia   . Anxiety   . Cervical cancer ([place])   . Crohn's disease ([place])   . GERD (gastroesophageal reflux disease)   . Tuberculosis    treated   Past Surgical History:  Procedure Laterality Date  . ABDOMINAL HYSTERECTOMY    . CESAREAN SECTION    . COLON SURGERY    . COLONOSCOPY WITH PROPOFOL N/A 03/02/2016   Procedure: COLONOSCOPY WITH PROPOFOL;  Surgeon: Manya Silvas, MD;  [facility]: [facility] ENDOSCOPY;  Service: Endoscopy;  Laterality: N/A;    Family History  Problem Relation Age of Onset  . Kidney disease Neg Hx   . Bladder Cancer Neg Hx   . Breast cancer Neg Hx     Social History   Socioeconomic History  . Marital status: Divorced    Spouse name: Not on file  . Number of children: Not on file  . Years of education: Not on file  . Highest education level: Not on file  Occupational History  . Not on file  Social Needs  . Financial resource strain: Not on file  . Food insecurity:    Worry: Not on file    Inability: Not on file  . Transportation needs:    Medical: Not on file    Non-medical: Not on file  Tobacco Use  . Smoking status: Current Every Day Smoker    Packs/day: 0.50    Types: Cigarettes  . Smokeless tobacco: Never Used  Substance and Sexual Activity  . Alcohol use: No  . Drug use: No  . Sexual activity: Yes  Lifestyle  . Physical activity:    Days per week: Not on file    Minutes per session: Not on file  . Stress: Not on file  Relationships  . Social connections:    Talks on phone: Not on file    Gets together: Not on file    Attends religious service: Not on file    Active member of club or organization: Not on file    Attends meetings of clubs or organizations: Not on file    Relationship status: Not on file  . Intimate partner violence:    Fear of current or ex partner: Not on file    Emotionally  abused: Not on file    Physically abused: Not on file    Forced sexual activity: Not on file  Other Topics Concern  . Not on file  Social History Narrative  . Not on file    Current Outpatient Medications on File Prior to Visit  Medication Sig Dispense Refill  . albuterol (PROVENTIL HFA;VENTOLIN HFA) 108 (90 Base) MCG/ACT inhaler Inhale 2 puffs into the lungs every 6 (six) hours as needed for wheezing or shortness of breath. 1 Inhaler 0  . Certolizumab Pegol 2 X 200 MG/ML KIT Inject 400 mg into the skin every 30 (thirty) days.    . cyanocobalamin (,VITAMIN B-12,) 1000 MCG/ML injection Inject into the muscle.    . Multiple Vitamin (MULTI-VITAMINS) TABS Take by mouth.    Marland Kitchen omeprazole (PRILOSEC) 20 MG capsule Take 20 mg by mouth daily.    . traMADol (ULTRAM) 50 MG tablet Take by mouth.    . Vitamin D, Ergocalciferol, (DRISDOL) 1.25 MG (50000 UT) CAPS capsule Take by mouth.     No current facility-administered medications on file prior to visit.     Allergies  Allergen Reactions  . Nsaids     Due to Crohns disease       Observations/Objective: There were no vitals filed for this visit. There is no height or weight on file to calculate BMI.  Physical Exam  Constitutional: She is oriented to person, place, and time. No distress.  HENT:  Head: Normocephalic and atraumatic.  Neck: Normal range of motion.  Pulmonary/Chest: Effort normal. No respiratory distress.  Neurological: She is alert and oriented to person, place, and time.  Psychiatric: Affect normal.   CBC    Component Value Date/Time   WBC 9.3 04/01/2019 1248   RBC 3.77 (L) 04/01/2019 1248   HGB 12.9 04/01/2019 1248   HGB 13.2 10/28/2013 1619   HCT 38.6 04/01/2019 1248   HCT 37.9 10/28/2013 1619   PLT 178 04/01/2019 1248   PLT 148 (L) 10/28/2013 1619   MCV 102.4 (H) 04/01/2019 1248   MCV 95 10/28/2013 1619   MCH 34.2 (H) 04/01/2019 1248   MCHC 33.4 04/01/2019 1248   RDW 12.9 04/01/2019 1248   RDW 13.0 10/28/2013  1619   LYMPHSABS 3.5 04/01/2019 1248   MONOABS 0.5 04/01/2019 1248   EOSABS 0.5 04/01/2019 1248   BASOSABS 0.1 04/01/2019 1248    CMP     Component Value Date/Time   NA 140 04/01/2019 1248   NA 137 10/28/2013 1619   K 3.7 04/01/2019 1248   K 3.8 10/28/2013 1619   CL 108 04/01/2019 1248   CL 106 10/28/2013 1619   CO2 24 04/01/2019 1248   CO2 28 10/28/2013 1619   GLUCOSE 131 (H) 04/01/2019 1248   GLUCOSE 116 (H) 10/28/2013 1619   BUN 17 04/01/2019 1248  BUN 14 08/08/2016 1436   BUN 12 10/28/2013 1619   CREATININE 0.82 04/01/2019 1248   CREATININE 0.88 10/28/2013 1619   CALCIUM 8.8 (L) 04/01/2019 1248   CALCIUM 8.8 10/28/2013 1619   PROT 7.0 04/01/2019 1248   PROT 7.0 10/28/2013 1619   ALBUMIN 3.9 04/01/2019 1248   ALBUMIN 3.6 10/28/2013 1619   AST 17 04/01/2019 1248   AST 21 10/28/2013 1619   ALT 10 04/01/2019 1248   ALT 21 10/28/2013 1619   ALKPHOS 80 04/01/2019 1248   ALKPHOS 127 10/28/2013 1619   BILITOT 0.7 04/01/2019 1248   BILITOT 0.3 10/28/2013 1619   GFRNONAA >60 04/01/2019 1248   GFRNONAA >60 10/28/2013 1619   GFRAA >60 04/01/2019 1248   GFRAA >60 10/28/2013 1619     Assessment and Plan: 1. Macrocytosis without anemia   2. Grief   3. Lung nodule     Labs were reviewed and discussed with patient.  Chronic macrocytosis, stable. No cytopenia.  Discussed with patient that macrocytosis can be a sign for MDS, for now her counts are stable and I recommend observation. She agrees with the plan.   Acute weight loss pet loss/grief.  Monitor weights.   Lung nodule: 16x 11m irregular lung nodule which has been stable since 2017. Last CT chest in 2019. Recommend repeat CT chest.   Follow Up Instructions: 3 months with repeat labs, MD assessment   I discussed the assessment and treatment plan with the patient. The patient was provided an opportunity to ask questions and all were answered. The patient agreed with the plan and demonstrated an understanding of the  instructions.  The patient was advised to call back or seek an in-person evaluation if the symptoms worsen or if the condition fails to improve as anticipated.   ZEarlie Server MD 04/02/2019 3:58 PM

## 2019-04-23 ENCOUNTER — Ambulatory Visit
Admission: RE | Admit: 2019-04-23 | Discharge: 2019-04-23 | Disposition: A | Discharge status: Home / Self Care | Payer: Medicare Other | Source: Ambulatory Visit | Attending: Oncology | Admitting: Oncology

## 2019-04-23 ENCOUNTER — Other Ambulatory Visit: Payer: Self-pay

## 2019-04-23 DIAGNOSIS — R911 Solitary pulmonary nodule: Secondary | ICD-10-CM | POA: Diagnosis present

## 2019-04-29 ENCOUNTER — Telehealth: Payer: Self-pay | Admitting: *Deleted

## 2019-04-29 NOTE — Telephone Encounter (Signed)
Received referral for lung screening program. Contacted patient and reviewed recent CT scan as well as plan to monitor lung screenings in the future. Patient is agreeable to this plan.

## 2019-07-01 ENCOUNTER — Encounter: Payer: Self-pay | Admitting: Oncology

## 2019-07-01 ENCOUNTER — Inpatient Hospital Stay: Payer: Medicare Other | Attending: Oncology

## 2019-07-01 ENCOUNTER — Inpatient Hospital Stay (HOSPITAL_BASED_OUTPATIENT_CLINIC_OR_DEPARTMENT_OTHER): Payer: Medicare Other | Admitting: Oncology

## 2019-07-01 ENCOUNTER — Other Ambulatory Visit: Payer: Self-pay

## 2019-07-01 VITALS — BP 124/66 | HR 59 | Temp 96.0°F | Ht 66.0 in | Wt 161.2 lb

## 2019-07-01 DIAGNOSIS — R911 Solitary pulmonary nodule: Secondary | ICD-10-CM | POA: Diagnosis not present

## 2019-07-01 DIAGNOSIS — D7589 Other specified diseases of blood and blood-forming organs: Secondary | ICD-10-CM

## 2019-07-01 DIAGNOSIS — K50919 Crohn's disease, unspecified, with unspecified complications: Secondary | ICD-10-CM | POA: Diagnosis not present

## 2019-07-01 DIAGNOSIS — R634 Abnormal weight loss: Secondary | ICD-10-CM | POA: Insufficient documentation

## 2019-07-01 LAB — COMPREHENSIVE METABOLIC PANEL
ALT: 9 U/L (ref 0–44)
AST: 15 U/L (ref 15–41)
Albumin: 3.5 g/dL (ref 3.5–5.0)
Alkaline Phosphatase: 81 U/L (ref 38–126)
Anion gap: 4 — ABNORMAL LOW (ref 5–15)
BUN: 24 mg/dL — ABNORMAL HIGH (ref 6–20)
CO2: 28 mmol/L (ref 22–32)
Calcium: 8.6 mg/dL — ABNORMAL LOW (ref 8.9–10.3)
Chloride: 108 mmol/L (ref 98–111)
Creatinine, Ser: 0.64 mg/dL (ref 0.44–1.00)
GFR calc Af Amer: >60 mL/min (ref >60)
GFR calc non Af Amer: >60 mL/min (ref >60)
Glucose, Bld: 97 mg/dL (ref 70–99)
Potassium: 3.7 mmol/L (ref 3.5–5.1)
Sodium: 140 mmol/L (ref 135–145)
Total Bilirubin: 0.6 mg/dL (ref 0.3–1.2)
Total Protein: 6.7 g/dL (ref 6.5–8.1)

## 2019-07-01 LAB — CBC WITH DIFFERENTIAL/PLATELET
Abs Immature Granulocytes: 0.01 10*3/uL (ref 0.00–0.07)
Basophils Absolute: 0.1 10*3/uL (ref 0.0–0.1)
Basophils Relative: 1 %
Eosinophils Absolute: 0.5 10*3/uL (ref 0.0–0.5)
Eosinophils Relative: 5 %
HCT: 40.3 % (ref 36.0–46.0)
Hemoglobin: 13.5 g/dL (ref 12.0–15.0)
Immature Granulocytes: 0 %
Lymphocytes Relative: 43 %
Lymphs Abs: 3.6 10*3/uL (ref 0.7–4.0)
MCH: 34.2 pg — ABNORMAL HIGH (ref 26.0–34.0)
MCHC: 33.5 g/dL (ref 30.0–36.0)
MCV: 102 fL — ABNORMAL HIGH (ref 80.0–100.0)
Monocytes Absolute: 0.6 10*3/uL (ref 0.1–1.0)
Monocytes Relative: 7 %
Neutro Abs: 3.6 10*3/uL (ref 1.7–7.7)
Neutrophils Relative %: 44 %
Platelets: 188 10*3/uL (ref 150–400)
RBC: 3.95 MIL/uL (ref 3.87–5.11)
RDW: 13.2 % (ref 11.5–15.5)
WBC: 8.3 10*3/uL (ref 4.0–10.5)
nRBC: 0 % (ref 0.0–0.2)

## 2019-07-01 LAB — VITAMIN B12: Vitamin B-12: 992 pg/mL — ABNORMAL HIGH (ref 180–914)

## 2019-07-01 LAB — FOLATE: Folate: 11.3 ng/mL (ref >5.9)

## 2019-07-01 NOTE — Progress Notes (Signed)
Hematology/Oncology Follow up note [facility] Telephone:([phone]) [phone] Fax:(336) [phone]   Patient Care Team: Ellamae Sia, MD as PCP - General (Internal Medicine)  REFERRING PROVIDER: [facility] Reason for Visit:  Evaluation of macrocytosis  HISTORY OF PRESENTING ILLNESS:  Rebecca Escobar is a  58 y.o.  female with PMH listed below who was referred to me for evaluation of macrocytosis.  Patient follows up with [facility] clinic GI group and has had labs done recently.  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  Reviewed patient's lab results in [place].  MCV has been above 100 since October 2016.  WBC count hemoglobin and platelet levels have been normal. Patient has had some basic workup done prior to coming to today's visit.  TSH was normal, folate and B12 was previously checked and was normal.  Ferritin was normal. Patient recently had an upper respiratory infection that has been started on a course of azithromycin and steroids.  She reports that prior to recent acute infection, she has been feeling well, no fatigue tired fever or chills.  No weight loss.  She has Crohn's disease and has been on certolizumab for the past 10 years.  No new medications or herbal supplementations.  No easy bruising or bleeding events. INTERVAL HISTORY Rebecca Escobar is a 58 y.o. female who has above history reviewed by me today presents for follow up visit for macrocytosis.  Patient reports feeling well. She has lost weight since last visit. Her dog passed away in May 11, 2023 this year and she has lost 15 pounds during couple of weeks time. Her appetite is getting better. Today she weighs 161 pounds.  She weighed 173 pounds on 08/30/2018. Lost 12 pounds. Denies any fever, chills, nausea, vomiting, night sweating. Current daily smoker, about 4 cigarettes a day. She has had a CT lung scan done in May 2020. Reports that her  chronic Crohn's disease has been at baseline.  Intermittent diarrhea.  Review of Systems  Constitutional: Positive for weight loss. Negative for chills, fever and malaise/fatigue.  HENT: Negative for ear discharge, nosebleeds and sore throat.   Eyes: Negative for pain, discharge and redness.  Respiratory: Negative for cough, hemoptysis, sputum production, shortness of breath and wheezing.   Cardiovascular: Negative for chest pain, palpitations, orthopnea, claudication and leg swelling.  Gastrointestinal: Positive for diarrhea. Negative for abdominal pain, blood in stool, heartburn, nausea and vomiting.  Genitourinary: Negative for dysuria and frequency.  Musculoskeletal: Negative for myalgias and neck pain.  Skin: Negative for rash.  Neurological: Negative for dizziness, tingling, tremors, sensory change and weakness.  Endo/Heme/Allergies: Does not bruise/bleed easily.  Psychiatric/Behavioral: Negative for depression, hallucinations and memory loss.    MEDICAL HISTORY:  Past Medical History:  Diagnosis Date  . Anemia   . Anxiety   . Cervical cancer ([place])   . Crohn's disease ([place])   . GERD (gastroesophageal reflux disease)   . Tuberculosis    treated    SURGICAL HISTORY: Past Surgical History:  Procedure Laterality Date  . ABDOMINAL HYSTERECTOMY    . CESAREAN SECTION    . COLON SURGERY    . COLONOSCOPY WITH PROPOFOL N/A 03/02/2016   Procedure: COLONOSCOPY WITH PROPOFOL;  Surgeon: Manya Silvas, MD;  Location: [facility] ENDOSCOPY;  Service: Endoscopy;  Laterality: N/A;    SOCIAL HISTORY: Social History   Socioeconomic History  . Marital status: Divorced    Spouse name: Not on file  . Number of children: Not  on file  . Years of education: Not on file  . Highest education level: Not on file  Occupational History  . Not on file  Social Needs  . Financial resource strain: Not on file  . Food insecurity    Worry: Not on file    Inability: Not on file  . Transportation  needs    Medical: Not on file    Non-medical: Not on file  Tobacco Use  . Smoking status: Current Every Day Smoker    Packs/day: 0.50    Types: Cigarettes  . Smokeless tobacco: Never Used  Substance and Sexual Activity  . Alcohol use: No  . Drug use: No  . Sexual activity: Yes  Lifestyle  . Physical activity    Days per week: Not on file    Minutes per session: Not on file  . Stress: Not on file  Relationships  . Social Herbalist on phone: Not on file    Gets together: Not on file    Attends religious service: Not on file    Active member of club or organization: Not on file    Attends meetings of clubs or organizations: Not on file    Relationship status: Not on file  . Intimate partner violence    Fear of current or ex partner: Not on file    Emotionally abused: Not on file    Physically abused: Not on file    Forced sexual activity: Not on file  Other Topics Concern  . Not on file  Social History Narrative  . Not on file    FAMILY HISTORY: Family History  Problem Relation Age of Onset  . Kidney disease Neg Hx   . Bladder Cancer Neg Hx   . Breast cancer Neg Hx     ALLERGIES:  is allergic to nsaids.  MEDICATIONS:  Current Outpatient Medications  Medication Sig Dispense Refill  . cyanocobalamin (,VITAMIN B-12,) 1000 MCG/ML injection Inject into the muscle.    . Multiple Vitamin (MULTI-VITAMINS) TABS Take by mouth.    Marland Kitchen omeprazole (PRILOSEC) 20 MG capsule Take 20 mg by mouth daily.    . traMADol (ULTRAM) 50 MG tablet Take by mouth.    . Vitamin D, Ergocalciferol, (DRISDOL) 1.25 MG (50000 UT) CAPS capsule Take by mouth.    Marland Kitchen albuterol (PROVENTIL HFA;VENTOLIN HFA) 108 (90 Base) MCG/ACT inhaler Inhale 2 puffs into the lungs every 6 (six) hours as needed for wheezing or shortness of breath. (Patient not taking: Reported on 07/01/2019) 1 Inhaler 0  . Certolizumab Pegol 2 X 200 MG/ML KIT Inject 400 mg into the skin every 30 (thirty) days.     No current  facility-administered medications for this visit.      PHYSICAL EXAMINATION: ECOG PERFORMANCE STATUS: 0 - Asymptomatic Vitals:   07/01/19 1028  BP: 124/66  Pulse: (!) 59  Temp: (!) 96 F (35.6 C)   Filed Weights   07/01/19 1028  Weight: 161 lb 3.2 oz (73.1 kg)    Physical Exam  Constitutional: She is oriented to person, place, and time. No distress.  Thin  HENT:  Head: Normocephalic and atraumatic.  Mouth/Throat: No oropharyngeal exudate.  Eyes: Pupils are equal, round, and reactive to light. Conjunctivae and EOM are normal. Left eye exhibits no discharge. No scleral icterus.  Neck: Normal range of motion. Neck supple.  Cardiovascular: Normal rate, regular rhythm and normal heart sounds.  No murmur heard. Pulmonary/Chest: Effort normal. No respiratory distress. She has no  wheezes. She has no rales. She exhibits no tenderness.  Abdominal: Soft. Bowel sounds are normal. She exhibits no distension and no mass. There is no abdominal tenderness.  Musculoskeletal: Normal range of motion.        General: No edema.  Lymphadenopathy:    She has no cervical adenopathy.  Neurological: She is alert and oriented to person, place, and time. No cranial nerve deficit. She exhibits normal muscle tone. Coordination normal.  Skin: Skin is warm and dry. She is not diaphoretic. No erythema.  Psychiatric: Affect and judgment normal.     LABORATORY DATA:  I have reviewed the data as listed Lab Results  Component Value Date   WBC 8.3 07/01/2019   HGB 13.5 07/01/2019   HCT 40.3 07/01/2019   MCV 102.0 (H) 07/01/2019   PLT 188 07/01/2019   Recent Labs    09/27/18 1408 04/01/19 1248 07/01/19 1013  NA 141 140 140  K 3.6 3.7 3.7  CL 106 108 108  CO2 _0 GLUCOSE 92 131* 97  BUN 14 17 24*  CREATININE 0.78 0.82 0.64  CALCIUM 9.4 8.8* 8.6*  GFRNONAA >60 >60 >60  GFRAA >60 >60 >60  PROT 7.1 7.0 6.7  ALBUMIN 4.2 3.9 3.5  AST _1 ALT _2 ALKPHOS 92 80 81  BILITOT 0.7  0.7 0.6    Labs from [facility].  TSH 1.651 Ferritin 95 MMA 152 11/01/2017 B12 546 01/08/2018 Homocysteine 20.5 01/17/2018 Folate 5.9   normal copper,  B12 level, , negative flowcytometry, normal LDH, no immature and morphological abnormality on smear review,  ASSESSMENT & PLAN:  1. Macrocytosis without anemia   2. Solitary pulmonary nodule   3. Crohn's disease with complication, unspecified gastrointestinal tract location ([place])   4. Weight loss    #Macrocytosis without anemia Labs are reviewed and discussed with patient.   Hemoglobin remained stable.  MCV 102.0, stable We have discussed about a possible underlying MDS.  Recommend monitoring her blood work. If she starts to develop cytopenia, will proceed with bone marrow biopsy.  Folate and vitamin B12 level has been monitored. #Weight loss, likely secondary to grief.  Will continue monitor her weight. Check TSH  #Lung nodule 04/23/2019 CT chest without contrast was entered and reviewed by me and discussed with patient Stable 1.5 centimeters nodule in the right lower lobe.  Consider annual low-dose lung cancer screening. Chronic emphysema changes. I have referred patient to lung cancer screening program. Smoking cessation was discussed with patient. All questions were answered. The patient knows to call the clinic with any problems questions or concerns.  Return of visit: 4 months.  Total face to face encounter time for this patient visit was 15 min. >50% of the time was  spent in counseling and coordination of care.    Earlie Server, MD, PhD Hematology Oncology [facility] at [facility] Pager- [phone] 07/01/2019

## 2019-07-01 NOTE — Progress Notes (Signed)
Patient would like to know if she could get a refill on her albuterol.

## 2019-08-29 ENCOUNTER — Other Ambulatory Visit: Payer: Self-pay | Admitting: Nurse Practitioner

## 2019-08-29 DIAGNOSIS — K50819 Crohn's disease of both small and large intestine with unspecified complications: Secondary | ICD-10-CM

## 2019-09-11 ENCOUNTER — Ambulatory Visit
Admission: RE | Admit: 2019-09-11 | Discharge: 2019-09-11 | Disposition: A | Discharge status: Home / Self Care | Payer: Medicare Other | Source: Ambulatory Visit | Attending: Nurse Practitioner | Admitting: Nurse Practitioner

## 2019-09-11 ENCOUNTER — Other Ambulatory Visit: Payer: Self-pay

## 2019-09-11 DIAGNOSIS — K50819 Crohn's disease of both small and large intestine with unspecified complications: Secondary | ICD-10-CM | POA: Diagnosis present

## 2019-09-11 MED ORDER — IOHEXOL 300 MG/ML  SOLN
100.0000 mL | Freq: Once | INTRAMUSCULAR | Status: AC | PRN
  Administered 2019-09-11: 100 mL via INTRAVENOUS

## 2019-11-01 ENCOUNTER — Inpatient Hospital Stay: Payer: Medicare Other | Admitting: Oncology

## 2019-11-01 ENCOUNTER — Inpatient Hospital Stay: Payer: Medicare Other | Attending: Oncology

## 2019-11-15 ENCOUNTER — Other Ambulatory Visit: Payer: Self-pay

## 2019-11-15 ENCOUNTER — Other Ambulatory Visit
Admission: RE | Admit: 2019-11-15 | Discharge: 2019-11-15 | Disposition: A | Discharge status: Home / Self Care | Payer: Medicare Other | Source: Ambulatory Visit | Attending: Internal Medicine | Admitting: Internal Medicine

## 2019-11-15 DIAGNOSIS — Z20828 Contact with and (suspected) exposure to other viral communicable diseases: Secondary | ICD-10-CM | POA: Diagnosis not present

## 2019-11-15 DIAGNOSIS — Z01812 Encounter for preprocedural laboratory examination: Secondary | ICD-10-CM | POA: Diagnosis present

## 2019-11-15 LAB — SARS CORONAVIRUS 2 (TAT 6-24 HRS): SARS Coronavirus 2: NEGATIVE

## 2019-11-18 ENCOUNTER — Other Ambulatory Visit
Admission: RE | Admit: 2019-11-18 | Discharge: 2019-11-18 | Disposition: A | Discharge status: Home / Self Care | Payer: Medicare Other | Source: Ambulatory Visit | Attending: Internal Medicine | Admitting: Internal Medicine

## 2019-11-20 ENCOUNTER — Ambulatory Visit: Payer: Medicare Other | Admitting: Anesthesiology

## 2019-11-20 ENCOUNTER — Encounter
Admission: RE | Disposition: A | Discharge status: Home / Self Care | Payer: Self-pay | Source: Home / Self Care | Attending: Internal Medicine

## 2019-11-20 ENCOUNTER — Ambulatory Visit
Admission: RE | Admit: 2019-11-20 | Discharge: 2019-11-20 | Disposition: A | Discharge status: Home / Self Care | Payer: Medicare Other | Attending: Internal Medicine | Admitting: Internal Medicine

## 2019-11-20 ENCOUNTER — Other Ambulatory Visit: Payer: Self-pay

## 2019-11-20 DIAGNOSIS — J449 Chronic obstructive pulmonary disease, unspecified: Secondary | ICD-10-CM | POA: Diagnosis not present

## 2019-11-20 DIAGNOSIS — K56609 Unspecified intestinal obstruction, unspecified as to partial versus complete obstruction: Secondary | ICD-10-CM | POA: Insufficient documentation

## 2019-11-20 DIAGNOSIS — K5090 Crohn's disease, unspecified, without complications: Secondary | ICD-10-CM | POA: Insufficient documentation

## 2019-11-20 DIAGNOSIS — K640 First degree hemorrhoids: Secondary | ICD-10-CM | POA: Insufficient documentation

## 2019-11-20 DIAGNOSIS — K449 Diaphragmatic hernia without obstruction or gangrene: Secondary | ICD-10-CM | POA: Diagnosis not present

## 2019-11-20 DIAGNOSIS — K6289 Other specified diseases of anus and rectum: Secondary | ICD-10-CM | POA: Insufficient documentation

## 2019-11-20 DIAGNOSIS — Z79899 Other long term (current) drug therapy: Secondary | ICD-10-CM | POA: Insufficient documentation

## 2019-11-20 DIAGNOSIS — K2100 Gastro-esophageal reflux disease with esophagitis, without bleeding: Secondary | ICD-10-CM | POA: Diagnosis present

## 2019-11-20 DIAGNOSIS — F419 Anxiety disorder, unspecified: Secondary | ICD-10-CM | POA: Insufficient documentation

## 2019-11-20 HISTORY — PX: COLONOSCOPY WITH PROPOFOL: SHX5780

## 2019-11-20 HISTORY — DX: Chronic obstructive pulmonary disease, unspecified: J44.9

## 2019-11-20 HISTORY — PX: ESOPHAGOGASTRODUODENOSCOPY (EGD) WITH PROPOFOL: SHX5813

## 2019-11-20 SURGERY — ESOPHAGOGASTRODUODENOSCOPY (EGD) WITH PROPOFOL
Anesthesia: General

## 2019-11-20 MED ORDER — PROPOFOL 500 MG/50ML IV EMUL
INTRAVENOUS | Status: DC | PRN
  Administered 2019-11-20: 100 ug/kg/min via INTRAVENOUS

## 2019-11-20 MED ORDER — SODIUM CHLORIDE 0.9 % IV SOLN
INTRAVENOUS | Status: DC
  Administered 2019-11-20 (×2): via INTRAVENOUS

## 2019-11-20 MED ORDER — PROPOFOL 500 MG/50ML IV EMUL
INTRAVENOUS | Status: AC
  Filled 2019-11-20: qty 50

## 2019-11-20 MED ORDER — LIDOCAINE HCL (CARDIAC) PF 100 MG/5ML IV SOSY
PREFILLED_SYRINGE | INTRAVENOUS | Status: DC | PRN
  Administered 2019-11-20: 100 mg via INTRAVENOUS

## 2019-11-20 MED ORDER — PROPOFOL 10 MG/ML IV BOLUS
INTRAVENOUS | Status: DC | PRN
  Administered 2019-11-20: 20 mg via INTRAVENOUS
  Administered 2019-11-20: 30 mg via INTRAVENOUS
  Administered 2019-11-20: 50 mg via INTRAVENOUS
  Administered 2019-11-20 (×3): 30 mg via INTRAVENOUS
  Administered 2019-11-20: 20 mg via INTRAVENOUS
  Administered 2019-11-20: 40 mg via INTRAVENOUS

## 2019-11-20 NOTE — Anesthesia Post-op Follow-up Note (Signed)
Anesthesia QCDR form completed.        

## 2019-11-20 NOTE — Anesthesia Postprocedure Evaluation (Signed)
Anesthesia Post Note  Patient: Rebecca Escobar  Procedure(s) Performed: ESOPHAGOGASTRODUODENOSCOPY (EGD) WITH PROPOFOL (N/A ) COLONOSCOPY WITH PROPOFOL (N/A )  Patient location during evaluation: Endoscopy Anesthesia Type: General Level of consciousness: awake and alert Pain management: pain level controlled Vital Signs Assessment: post-procedure vital signs reviewed and stable Respiratory status: spontaneous breathing and respiratory function stable Cardiovascular status: stable Anesthetic complications: no     Last Vitals:  Vitals:   11/20/19 1031 11/20/19 1041  BP: (!) 172/74 (!) 165/99  Pulse:    Resp:    Temp:    SpO2:      Last Pain:  Vitals:   11/20/19 1021  TempSrc:   PainSc: 9                  KEPHART,WILLIAM K

## 2019-11-20 NOTE — Interval H&P Note (Signed)
History and Physical Interval Note:  11/20/2019 8:55 AM  Rebecca Escobar  has presented today for surgery, with the diagnosis of GERD,CROHN'S DISEASE.  The various methods of treatment have been discussed with the patient and family. After consideration of risks, benefits and other options for treatment, the patient has consented to  Procedure(s): ESOPHAGOGASTRODUODENOSCOPY (EGD) WITH PROPOFOL (N/A) COLONOSCOPY WITH PROPOFOL (N/A) as a surgical intervention.  The patient's history has been reviewed, patient examined, no change in status, stable for surgery.  I have reviewed the patient's chart and labs.  Questions were answered to the patient's satisfaction.     [place], [place]

## 2019-11-20 NOTE — H&P (Signed)
Outpatient short stay form Pre-procedure 11/20/2019 8:53 AM  K. , M.D.  Primary Physician: Harriett Burns, M.D.  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 Patient denies melena, hematochezia, nausea, vomiting. Has chronically loose stools, 2-3 per day. No nocturnal stools or weight loss. Patient has moderate GERD controlled with PPI therapy. No dysphagia. Has mild epigastric discomfort without known aggravating or alleviating factors. Takes tramadol for pain.     Current Facility-Administered Medications:  .  0.9 %  sodium chloride infusion, , Intravenous, Continuous, ,  K, MD, Last Rate: 20 mL/hr at 11/20/19 0843  Medications Prior to Admission  Medication Sig Dispense Refill Last Dose  . albuterol (PROVENTIL HFA;VENTOLIN HFA) 108 (90 Base) MCG/ACT inhaler Inhale 2 puffs into the lungs every 6 (six) hours as needed for wheezing or shortness of breath. 1 Inhaler 0 Past Week at Unknown time  . Certolizumab Pegol 2 X 200 MG/ML KIT Inject 400 mg into the skin every 30 (thirty) days.   Past Month at Unknown time  . cyanocobalamin (,VITAMIN B-12,) 1000 MCG/ML injection Inject into the muscle.   Past Month at Unknown time  . Multiple Vitamin (MULTI-VITAMINS) TABS Take by mouth.   Past Month at Unknown time  . omeprazole (PRILOSEC) 20 MG capsule Take 20 mg by mouth daily.   11/20/2019 at 0500  . traMADol (ULTRAM) 50 MG tablet Take by mouth.   Past Month at Unknown time  . Vitamin D, Ergocalciferol, (DRISDOL) 1.25 MG (50000 UT) CAPS capsule Take by mouth.        Allergies  Allergen Reactions  . Nsaids     Due to Crohns disease     Past Medical History:  Diagnosis Date  . Anemia   . Anxiety   . Cervical cancer (HCC)   . COPD (chronic obstructive pulmonary disease) (HCC)    Pt states she has "some" copd  .  Crohn's disease (H[facility])   . GERD (gastroesophageal reflux disease)   . Tuberculosis    treated    Review of systems:  Otherwise negative.    Physical Exam  Gen: Alert, oriented. Appears stated age.  HEENT: [place]/AT. PERRLA. Lungs: CTA, no wheezes. CV: RR nl S1, S2. Abd: soft, benign, no masses. BS+ Ext: No edema. Pulses 2+    Planned procedures: Proceed with EGD and colonoscopy. The patient understands the nature of the planned procedure, indications, risks, alternatives and potential complications including but not limited to bleeding, infection, perforation, damage to internal organs and possible oversedation/side effects from anesthesia. The patient agrees and gives consent to proceed.  Please refer to procedure notes for findings, recommendations and patient disposition/instructions.      K. , M.D. Gastroenterology 11/20/2019  8:53 AM      

## 2019-11-20 NOTE — Transfer of Care (Signed)
Immediate Anesthesia Transfer of Care Note  Patient: Rebecca Escobar  Procedure(s) Performed: ESOPHAGOGASTRODUODENOSCOPY (EGD) WITH PROPOFOL (N/A ) COLONOSCOPY WITH PROPOFOL (N/A )  Patient Location: PACU  Anesthesia Type:General  Level of Consciousness: sedated  Airway & Oxygen Therapy: Patient Spontanous Breathing and Patient connected to face mask oxygen  Post-op Assessment: Report given to RN and Post -op Vital signs reviewed and stable  Post vital signs: Reviewed and stable  Last Vitals:  Vitals Value Taken Time  BP 144/133 11/20/19 1014  Temp    Pulse 68 11/20/19 1015  Resp 13 11/20/19 1015  SpO2 100 % 11/20/19 1015  Vitals shown include unvalidated device data.  Last Pain:  Vitals:   11/20/19 0828  PainSc: 0-No pain         Complications: No apparent anesthesia complications

## 2019-11-20 NOTE — Op Note (Signed)
[facility] Gastroenterology Patient Name: Rebecca Escobar Procedure Date: 11/20/2019 8:33 AM MRN: [id number] Account #: [id number] Date of Birth: 1961-10-30 Admit Type: Outpatient Age: 58 Room: [facility] ENDO ROOM 3 Gender: Female Note Status: Finalized Procedure:             Colonoscopy Indications:           Disease activity assessment of Crohn's disease of the                         small bowel and colon Providers:             Benay Pike. Alice Reichert MD, MD Referring MD:          Remus Blake MD, MD (Referring MD) Medicines:             Propofol per Anesthesia Complications:         No immediate complications. No immediate                         complications. Estimated blood loss: Minimal. Procedure:             Pre-Anesthesia Assessment:                        - The risks and benefits of the procedure and the                         sedation options and risks were discussed with the                         patient. All questions were answered and informed                         consent was obtained.                        - Patient identification and proposed procedure were                         verified prior to the procedure by the nurse. The                         procedure was verified in the procedure room.                        After obtaining informed consent, the colonoscope was                         passed under direct vision. Throughout the procedure,                         the patient's blood pressure, pulse, and oxygen                         saturations were monitored continuously. The                         Colonoscope was introduced through the anus and  advanced to the 10 cm into the ileum. The terminal                         ileum, ileocecal valve, appendiceal orifice, and                         rectum were photographed. The colonoscopy was                         technically difficult and complex due to bowel                          stenosis. Successful completion of the procedure was                         aided by performing the maneuvers documented (below)                         in this report. The patient tolerated the procedure                         well. The quality of the bowel preparation was good. Findings:      The perianal and digital rectal examinations were normal. Pertinent       negatives include normal sphincter tone and no palpable rectal lesions.      The ileum, 0 cm from the ileocecal valve contained a benign-appearing,       intrinsic severe stenosis measuring 2 cm (in length) x 8 mm (inner       diameter) that was traversed after dilation. A TTS dilator was passed       through the scope. Dilation with a 09-29-11 mm and a 12-13.5-15 mm       colonic balloon dilator was performed. The dilation site was examined       following endoscope reinsertion and showed moderate improvement in       luminal narrowing and no perforation. Biopsies were taken with a cold       forceps for histology.      The remainder of the exam in the terminal ileum was normal.      Biopsies were taken with a cold forceps in the distal ileum for       histology.      The colon (entire examined portion) appeared normal. Two biopsies were       taken every 10 cm with a cold forceps from the cecum to rectum [for       surveillance]. These biopsy specimens [labeled 60, 64, 45,35,25cm and       rectum] were sent to Pathology. Estimated blood loss was minimal.      Non-bleeding internal hemorrhoids were found during retroflexion. The       hemorrhoids were Grade I (internal hemorrhoids that do not prolapse).      The exam was otherwise without abnormality. 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 Dilated. Biopsied.                        -  The entire examined colon is normal. Biopsied.                        - Non-bleeding internal  hemorrhoids.                        - The examination was otherwise normal.                        - Biopsies were taken with a cold forceps for                         histology in the distal ileum. Recommendation:        - Patient has a contact number available for                         emergencies. The signs and symptoms of potential                         delayed complications were discussed with the patient.                         Return to normal activities tomorrow. Written                         discharge instructions were provided to the patient.                        - Resume previous diet.                        - Continue present medications.                        - Await pathology results.                        - Repeat colonoscopy is recommended for surveillance.                         The colonoscopy date will be determined after                         pathology results from today's exam become available                         for review.                        - Return to physician assistant in 3 months.                        - The findings and recommendations were discussed with                         the patient. Procedure Code(s):     --- Professional ---                        [phone], Colonoscopy, flexible; with transendoscopic  balloon dilation                        45380, Colonoscopy, flexible; with biopsy, single or                         multiple Diagnosis Code(s):     --- Professional ---                        M14.709, Crohn's disease of both small and large                         intestine with intestinal obstruction                        K64.0, First degree hemorrhoids CPT copyright 2019 American Medical Association. All rights reserved. The codes documented in this report are preliminary and upon coder review may  be revised to meet current compliance requirements. Efrain Sella MD, MD 11/20/2019 10:13:58 AM This  report has been signed electronically. Number of Addenda: 0 Note Initiated On: 11/20/2019 8:33 AM Scope Withdrawal Time: 0 hours 48 minutes 12 seconds  Total Procedure Duration: 0 hours 55 minutes 23 seconds  Estimated Blood Loss:  Estimated blood loss was minimal.      [facility]

## 2019-11-20 NOTE — Op Note (Signed)
[facility] Patient Name: Rebecca Escobar Procedure Date: 11/20/2019 8:34 AM MRN: [id number] Account #: [id number] Date of Birth: 1961-11-19 Admit Type: Outpatient Age: 58 Room: [facility] ENDO ROOM 3 Gender: Female Note Status: Finalized Procedure:             Upper GI endoscopy Indications:           Esophageal reflux Providers:             Benay Pike. Alice Reichert MD, MD Referring MD:          Remus Blake MD, MD (Referring MD) Medicines:             Propofol per Anesthesia Complications:         No immediate complications. Procedure:             Pre-Anesthesia Assessment:                        - The risks and benefits of the procedure and the                         sedation options and risks were discussed with the                         patient. All questions were answered and informed                         consent was obtained.                        - Patient identification and proposed procedure were                         verified prior to the procedure by the nurse. The                         procedure was verified in the procedure room.                        - ASA Grade Assessment: III - A patient with severe                         systemic disease.                        - After reviewing the risks and benefits, the patient                         was deemed in satisfactory condition to undergo the                         procedure.                        After obtaining informed consent, the endoscope was                         passed under direct vision. Throughout the procedure,                         the patient's blood pressure,  pulse, and oxygen                         saturations were monitored continuously. The Endoscope                         was introduced through the mouth, and advanced to the                         third part of duodenum. The upper GI endoscopy was                         accomplished without  difficulty. The patient tolerated                         the procedure well. Findings:      The esophagus was normal.      A 1 cm hiatal hernia was present.      The examined duodenum was normal.      The exam was otherwise without abnormality. Impression:            - Normal esophagus.                        - 1 cm hiatal hernia.                        - Normal examined duodenum.                        - The examination was otherwise normal.                        - No specimens collected. Recommendation:        - Continue present medications.                        - Proceed with colonoscopy Procedure Code(s):     --- Professional ---                        [phone], Esophagogastroduodenoscopy, flexible,                         transoral; diagnostic, including collection of                         specimen(s) by brushing or washing, when performed                         (separate procedure) Diagnosis Code(s):     --- Professional ---                        K21.9, Gastro-esophageal reflux disease without                         esophagitis                        K44.9, Diaphragmatic hernia without obstruction or  gangrene CPT copyright 2019 American Medical Association. All rights reserved. The codes documented in this report are preliminary and upon coder review may  be revised to meet current compliance requirements. Efrain Sella MD, MD 11/20/2019 9:07:56 AM This report has been signed electronically. Number of Addenda: 0 Note Initiated On: 11/20/2019 8:34 AM Estimated Blood Loss:  Estimated blood loss: none.      [facility]

## 2019-11-20 NOTE — Anesthesia Preprocedure Evaluation (Signed)
Anesthesia Evaluation  Patient identified by MRN, date of birth, ID band Patient awake    Reviewed: Allergy & Precautions, NPO status , Patient's Chart, lab work & pertinent test results  History of Anesthesia Complications Negative for: history of anesthetic complications  Airway Mallampati: II       Dental   Pulmonary neg sleep apnea, COPD,  COPD inhaler, Current Smoker,           Cardiovascular (-) hypertension(-) Past MI and (-) CHF (-) dysrhythmias (-) Valvular Problems/Murmurs     Neuro/Psych neg Seizures Anxiety    GI/Hepatic Neg liver ROS, GERD  Medicated and Controlled,  Endo/Other  neg diabetes  Renal/GU negative Renal ROS     Musculoskeletal   Abdominal   Peds  Hematology  (+) anemia ,   Anesthesia Other Findings   Reproductive/Obstetrics                             Anesthesia Physical Anesthesia Plan  ASA: II  Anesthesia Plan: General   Post-op Pain Management:    Induction: Intravenous  PONV Risk Score and Plan: 2  Airway Management Planned: Nasal Cannula  Additional Equipment:   Intra-op Plan:   Post-operative Plan:   Informed Consent: I have reviewed the patients History and Physical, chart, labs and discussed the procedure including the risks, benefits and alternatives for the proposed anesthesia with the patient or authorized representative who has indicated his/her understanding and acceptance.       Plan Discussed with:   Anesthesia Plan Comments:         Anesthesia Quick Evaluation

## 2019-11-21 ENCOUNTER — Encounter: Payer: Self-pay | Admitting: Internal Medicine

## 2019-11-21 LAB — SURGICAL PATHOLOGY

## 2019-11-27 ENCOUNTER — Other Ambulatory Visit: Payer: Medicare Other

## 2019-11-27 ENCOUNTER — Ambulatory Visit: Payer: Medicare Other | Admitting: Oncology

## 2019-11-27 ENCOUNTER — Inpatient Hospital Stay (HOSPITAL_BASED_OUTPATIENT_CLINIC_OR_DEPARTMENT_OTHER): Payer: Medicare Other | Admitting: Oncology

## 2019-11-27 ENCOUNTER — Encounter: Payer: Self-pay | Admitting: Oncology

## 2019-11-27 ENCOUNTER — Other Ambulatory Visit: Payer: Self-pay

## 2019-11-27 ENCOUNTER — Inpatient Hospital Stay: Payer: Medicare Other | Attending: Oncology

## 2019-11-27 VITALS — BP 133/88 | HR 78 | Temp 97.4°F | Resp 18 | Wt 160.9 lb

## 2019-11-27 DIAGNOSIS — K5090 Crohn's disease, unspecified, without complications: Secondary | ICD-10-CM | POA: Insufficient documentation

## 2019-11-27 DIAGNOSIS — D7589 Other specified diseases of blood and blood-forming organs: Secondary | ICD-10-CM

## 2019-11-27 DIAGNOSIS — D539 Nutritional anemia, unspecified: Secondary | ICD-10-CM | POA: Diagnosis present

## 2019-11-27 DIAGNOSIS — Z720 Tobacco use: Secondary | ICD-10-CM

## 2019-11-27 DIAGNOSIS — R911 Solitary pulmonary nodule: Secondary | ICD-10-CM | POA: Diagnosis not present

## 2019-11-27 DIAGNOSIS — R634 Abnormal weight loss: Secondary | ICD-10-CM | POA: Insufficient documentation

## 2019-11-27 DIAGNOSIS — K50919 Crohn's disease, unspecified, with unspecified complications: Secondary | ICD-10-CM

## 2019-11-27 LAB — RETIC PANEL
Immature Retic Fract: 16.7 % — ABNORMAL HIGH (ref 2.3–15.9)
RBC.: 3.54 MIL/uL — ABNORMAL LOW (ref 3.87–5.11)
Retic Count, Absolute: 85.7 10*3/uL (ref 19.0–186.0)
Retic Ct Pct: 2.4 % (ref 0.4–3.1)
Reticulocyte Hemoglobin: 35.6 pg (ref >27.9)

## 2019-11-27 LAB — TSH: TSH: 1.256 u[IU]/mL (ref 0.350–4.500)

## 2019-11-27 LAB — CBC WITH DIFFERENTIAL/PLATELET
Abs Immature Granulocytes: 0.02 10*3/uL (ref 0.00–0.07)
Basophils Absolute: 0.1 10*3/uL (ref 0.0–0.1)
Basophils Relative: 1 %
Eosinophils Absolute: 0.2 10*3/uL (ref 0.0–0.5)
Eosinophils Relative: 2 %
HCT: 36.5 % (ref 36.0–46.0)
Hemoglobin: 11.8 g/dL — ABNORMAL LOW (ref 12.0–15.0)
Immature Granulocytes: 0 %
Lymphocytes Relative: 42 %
Lymphs Abs: 3 10*3/uL (ref 0.7–4.0)
MCH: 33.3 pg (ref 26.0–34.0)
MCHC: 32.3 g/dL (ref 30.0–36.0)
MCV: 103.1 fL — ABNORMAL HIGH (ref 80.0–100.0)
Monocytes Absolute: 0.4 10*3/uL (ref 0.1–1.0)
Monocytes Relative: 5 %
Neutro Abs: 3.5 10*3/uL (ref 1.7–7.7)
Neutrophils Relative %: 50 %
Platelets: 214 10*3/uL (ref 150–400)
RBC: 3.54 MIL/uL — ABNORMAL LOW (ref 3.87–5.11)
RDW: 13 % (ref 11.5–15.5)
WBC: 7.1 10*3/uL (ref 4.0–10.5)
nRBC: 0 % (ref 0.0–0.2)

## 2019-11-28 DIAGNOSIS — D539 Nutritional anemia, unspecified: Secondary | ICD-10-CM | POA: Insufficient documentation

## 2019-11-28 NOTE — Progress Notes (Signed)
Hematology/Oncology Follow up note [facility] [phone] Fax:(336) [phone]   Patient Care Team: Ellamae Sia, MD as PCP - General (Internal Medicine)  REFERRING PROVIDER: [facility] Reason for Visit:  Follow-up for microcytosis  HISTORY OF PRESENTING ILLNESS:  Rebecca Escobar is a  58 y.o.  female with PMH listed below who was referred to me for evaluation of macrocytosis.  Patient follows up with [facility] GI group and has had labs done recently.  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  Reviewed patient's lab results in [place].  MCV has been above 100 since October 2016.  WBC count hemoglobin and platelet levels have been normal. She has Crohn's disease and has been on certolizumab for the past 10 years.  No new medications or herbal supplementations.  No easy bruising or bleeding events.  CT lung scan done in May 2020. # chronic Crohn's disease has been at baseline.  Intermittent diarrhea.  INTERVAL HISTORY Rebecca Escobar is a 58 y.o. female who has above history reviewed by me today presents for follow up visit for macrocytosis.  Patient reports feeling okay.  She has been through out of stress in her personal life.  She is taking care of her mother who is sick. Her weight has been stable since July 2020. Denies any fever, chills, nausea, vomiting, night sweating. Current daily smoker.  Review of Systems  Constitutional: Negative for chills, fever, malaise/fatigue and weight loss.  HENT: Negative for ear discharge, nosebleeds and sore throat.   Eyes: Negative for pain, discharge and redness.  Respiratory: Negative for cough, hemoptysis, sputum production, shortness of breath and wheezing.   Cardiovascular: Negative for chest pain, palpitations, orthopnea, claudication and leg swelling.  Gastrointestinal: Positive for diarrhea. Negative for abdominal pain, blood in  stool, heartburn, nausea and vomiting.  Genitourinary: Negative for dysuria and frequency.  Musculoskeletal: Negative for myalgias and neck pain.  Skin: Negative for rash.  Neurological: Negative for dizziness, tingling, tremors, sensory change and weakness.  Endo/Heme/Allergies: Does not bruise/bleed easily.  Psychiatric/Behavioral: Negative for depression, hallucinations and memory loss.    MEDICAL HISTORY:  Past Medical History:  Diagnosis Date  . Anemia   . Anxiety   . Cervical cancer ([place])   . COPD (chronic obstructive pulmonary disease) ([place])    Pt states she has "some" copd  . Crohn's disease ([place])   . GERD (gastroesophageal reflux disease)   . Tuberculosis    treated    SURGICAL HISTORY: Past Surgical History:  Procedure Laterality Date  . ABDOMINAL HYSTERECTOMY    . CESAREAN SECTION    . COLON SURGERY    . COLONOSCOPY WITH PROPOFOL N/A 03/02/2016   Procedure: COLONOSCOPY WITH PROPOFOL;  Surgeon: Manya Silvas, MD;  Location: [facility] ENDOSCOPY;  Service: Endoscopy;  Laterality: N/A;  . COLONOSCOPY WITH PROPOFOL N/A 11/20/2019   Procedure: COLONOSCOPY WITH PROPOFOL;  Surgeon: Toledo, Benay Pike, MD;  Location: [facility] ENDOSCOPY;  Service: Gastroenterology;  Laterality: N/A;  . ESOPHAGOGASTRODUODENOSCOPY (EGD) WITH PROPOFOL N/A 11/20/2019   Procedure: ESOPHAGOGASTRODUODENOSCOPY (EGD) WITH PROPOFOL;  Surgeon: Toledo, Benay Pike, MD;  Location: [facility] ENDOSCOPY;  Service: Gastroenterology;  Laterality: N/A;    SOCIAL HISTORY: Social History   Socioeconomic History  . Marital status: Divorced    Spouse name: Not on file  . Number of children: Not on file  . Years of education: Not on file  . Highest education level: Not on file  Occupational History  .  Not on file  Tobacco Use  . Smoking status: Current Every Day Smoker    Packs/day: 0.50    Types: Cigarettes  . Smokeless tobacco: Never Used  Substance and Sexual Activity  . Alcohol use: No  . Drug use: No  . Sexual  activity: Yes  Other Topics Concern  . Not on file  Social History Narrative  . Not on file   Social Determinants of Health   Financial Resource Strain:   . Difficulty of Paying Living Expenses: Not on file  Food Insecurity:   . Worried About Charity fundraiser in the Last Year: Not on file  . Ran Out of Food in the Last Year: Not on file  Transportation Needs:   . Lack of Transportation (Medical): Not on file  . Lack of Transportation (Non-Medical): Not on file  Physical Activity:   . Days of Exercise per Week: Not on file  . Minutes of Exercise per Session: Not on file  Stress:   . Feeling of Stress : Not on file  Social Connections:   . Frequency of Communication with Friends and Family: Not on file  . Frequency of Social Gatherings with Friends and Family: Not on file  . Attends Religious Services: Not on file  . Active Member of Clubs or Organizations: Not on file  . Attends Archivist Meetings: Not on file  . Marital Status: Not on file  Intimate Partner Violence:   . Fear of Current or Ex-Partner: Not on file  . Emotionally Abused: Not on file  . Physically Abused: Not on file  . Sexually Abused: Not on file    FAMILY HISTORY: Family History  Problem Relation Age of Onset  . Kidney disease Neg Hx   . Bladder Cancer Neg Hx   . Breast cancer Neg Hx     ALLERGIES:  is allergic to nsaids.  MEDICATIONS:  Current Outpatient Medications  Medication Sig Dispense Refill  . albuterol (PROVENTIL HFA;VENTOLIN HFA) 108 (90 Base) MCG/ACT inhaler Inhale 2 puffs into the lungs every 6 (six) hours as needed for wheezing or shortness of breath. 1 Inhaler 0  . Certolizumab Pegol 2 X 200 MG/ML KIT Inject 400 mg into the skin every 30 (thirty) days.    . cyanocobalamin (,VITAMIN B-12,) 1000 MCG/ML injection Inject into the muscle.    Marland Kitchen FLOVENT HFA 110 MCG/ACT inhaler Inhale 2 puffs into the lungs 2 (two) times daily.    . Multiple Vitamin (MULTI-VITAMINS) TABS Take by  mouth.    Marland Kitchen omeprazole (PRILOSEC) 20 MG capsule Take 20 mg by mouth daily.    . traMADol (ULTRAM) 50 MG tablet Take by mouth.    . valACYclovir (VALTREX) 1000 MG tablet Take 1,000 mg by mouth daily.    . Vitamin D, Ergocalciferol, (DRISDOL) 1.25 MG (50000 UT) CAPS capsule Take by mouth.     No current facility-administered medications for this visit.     PHYSICAL EXAMINATION: ECOG PERFORMANCE STATUS: 0 - Asymptomatic Vitals:   11/27/19 1442  BP: 133/88  Pulse: 78  Resp: 18  Temp: (!) 97.4 F (36.3 C)   Filed Weights   11/27/19 1442  Weight: 160 lb 14.4 oz (73 kg)    Physical Exam  Constitutional: She is oriented to person, place, and time. No distress.  Thin  HENT:  Head: Normocephalic and atraumatic.  Nose: Nose normal.  Mouth/Throat: Oropharynx is clear and moist. No oropharyngeal exudate.  Eyes: Pupils are equal, round, and reactive to  light. Conjunctivae and EOM are normal. Left eye exhibits no discharge. No scleral icterus.  Cardiovascular: Normal rate, regular rhythm and normal heart sounds.  No murmur heard. Pulmonary/Chest: Effort normal. No respiratory distress. She has no wheezes. She has no rales. She exhibits no tenderness.  Abdominal: Soft. Bowel sounds are normal. She exhibits no distension and no mass. There is no abdominal tenderness.  Musculoskeletal:        General: No edema. Normal range of motion.     Cervical back: Normal range of motion and neck supple.  Lymphadenopathy:    She has no cervical adenopathy.  Neurological: She is alert and oriented to person, place, and time. No cranial nerve deficit. She exhibits normal muscle tone. Coordination normal.  Skin: Skin is warm and dry. She is not diaphoretic. No erythema.  Psychiatric: Mood normal.     LABORATORY DATA:  I have reviewed the data as listed Lab Results  Component Value Date   WBC 7.1 11/27/2019   HGB 11.8 (L) 11/27/2019   HCT 36.5 11/27/2019   MCV 103.1 (H) 11/27/2019   PLT 214  11/27/2019   Recent Labs    04/01/19 1248 07/01/19 1013  NA 140 140  K 3.7 3.7  CL 108 108  CO2 24 28  GLUCOSE 131* 97  BUN 17 24*  CREATININE 0.82 0.64  CALCIUM 8.8* 8.6*  GFRNONAA >60 >60  GFRAA >60 >60  PROT 7.0 6.7  ALBUMIN 3.9 3.5  AST 17 15  ALT 10 9  ALKPHOS 80 81  BILITOT 0.7 0.6    Labs from [facility].  TSH 1.651 Ferritin 95 MMA 152 11/01/2017 B12 546 01/08/2018 Homocysteine 20.5 01/17/2018 Folate 5.9   normal copper,  B12 level, , negative flowcytometry, normal LDH, no immature and morphological abnormality on smear review,  ASSESSMENT & PLAN:  1. Macrocytic anemia   2. Solitary pulmonary nodule   3. Tobacco use    #Macrocytic anemia. Labs are reviewed and discussed with patient. During the interval, patient has developed a very mild anemia with a hemoglobin of 11.8.  MCV 103.1. Previously vitamin B12 and folate level have been checked and they are normal. I repeated in 3 months. If patient has persistent anemia, with normal B12 and folate level, would recommend patient to proceed with bone marrow biopsy for further evaluation.  Rule out MDS.  Weight loss, weight has been stable since last visit. #Lung nodule, Patient has been referred to lung cancer screening program. He is due to have a repeat annual lung cancer screening in May 2021. Smoking cessation.  All questions were answered. The patient knows to call the clinic with any problems questions or concerns.  Return of visit: 3 months. Earlie Server, MD, PhD Hematology Oncology [facility] Pager- [phone] 11/28/2019

## 2020-02-24 ENCOUNTER — Other Ambulatory Visit: Payer: Self-pay

## 2020-02-24 ENCOUNTER — Encounter: Payer: Self-pay | Admitting: Oncology

## 2020-02-24 NOTE — Progress Notes (Signed)
Pre assessment call completed. Patient denies any concerns.

## 2020-02-25 ENCOUNTER — Encounter: Payer: Self-pay | Admitting: Oncology

## 2020-02-25 ENCOUNTER — Inpatient Hospital Stay (HOSPITAL_BASED_OUTPATIENT_CLINIC_OR_DEPARTMENT_OTHER): Payer: Medicare Other | Admitting: Oncology

## 2020-02-25 ENCOUNTER — Inpatient Hospital Stay: Payer: Medicare Other | Attending: Oncology

## 2020-02-25 ENCOUNTER — Other Ambulatory Visit: Payer: Self-pay

## 2020-02-25 VITALS — BP 149/83 | HR 69 | Temp 96.0°F | Resp 16 | Wt 158.4 lb

## 2020-02-25 DIAGNOSIS — K5090 Crohn's disease, unspecified, without complications: Secondary | ICD-10-CM | POA: Diagnosis not present

## 2020-02-25 DIAGNOSIS — Z720 Tobacco use: Secondary | ICD-10-CM | POA: Diagnosis not present

## 2020-02-25 DIAGNOSIS — D539 Nutritional anemia, unspecified: Secondary | ICD-10-CM

## 2020-02-25 DIAGNOSIS — R911 Solitary pulmonary nodule: Secondary | ICD-10-CM

## 2020-02-25 DIAGNOSIS — F17210 Nicotine dependence, cigarettes, uncomplicated: Secondary | ICD-10-CM | POA: Diagnosis not present

## 2020-02-25 LAB — COMPREHENSIVE METABOLIC PANEL
ALT: 10 U/L (ref 0–44)
AST: 15 U/L (ref 15–41)
Albumin: 3.9 g/dL (ref 3.5–5.0)
Alkaline Phosphatase: 87 U/L (ref 38–126)
Anion gap: 10 (ref 5–15)
BUN: 13 mg/dL (ref 6–20)
CO2: 25 mmol/L (ref 22–32)
Calcium: 8.9 mg/dL (ref 8.9–10.3)
Chloride: 103 mmol/L (ref 98–111)
Creatinine, Ser: 0.63 mg/dL (ref 0.44–1.00)
GFR calc Af Amer: >60 mL/min (ref >60)
GFR calc non Af Amer: >60 mL/min (ref >60)
Glucose, Bld: 106 mg/dL — ABNORMAL HIGH (ref 70–99)
Potassium: 3.8 mmol/L (ref 3.5–5.1)
Sodium: 138 mmol/L (ref 135–145)
Total Bilirubin: 0.6 mg/dL (ref 0.3–1.2)
Total Protein: 6.9 g/dL (ref 6.5–8.1)

## 2020-02-25 LAB — CBC WITH DIFFERENTIAL/PLATELET
Abs Immature Granulocytes: 0.02 10*3/uL (ref 0.00–0.07)
Basophils Absolute: 0.1 10*3/uL (ref 0.0–0.1)
Basophils Relative: 1 %
Eosinophils Absolute: 0.3 10*3/uL (ref 0.0–0.5)
Eosinophils Relative: 4 %
HCT: 41.9 % (ref 36.0–46.0)
Hemoglobin: 13.8 g/dL (ref 12.0–15.0)
Immature Granulocytes: 0 %
Lymphocytes Relative: 44 %
Lymphs Abs: 3.3 10*3/uL (ref 0.7–4.0)
MCH: 33.7 pg (ref 26.0–34.0)
MCHC: 32.9 g/dL (ref 30.0–36.0)
MCV: 102.2 fL — ABNORMAL HIGH (ref 80.0–100.0)
Monocytes Absolute: 0.6 10*3/uL (ref 0.1–1.0)
Monocytes Relative: 7 %
Neutro Abs: 3.3 10*3/uL (ref 1.7–7.7)
Neutrophils Relative %: 44 %
Platelets: 182 10*3/uL (ref 150–400)
RBC: 4.1 MIL/uL (ref 3.87–5.11)
RDW: 12.9 % (ref 11.5–15.5)
WBC: 7.6 10*3/uL (ref 4.0–10.5)
nRBC: 0 % (ref 0.0–0.2)

## 2020-02-25 LAB — FOLATE: Folate: 13.2 ng/mL (ref >5.9)

## 2020-02-25 LAB — VITAMIN B12: Vitamin B-12: 489 pg/mL (ref 180–914)

## 2020-02-25 NOTE — Progress Notes (Signed)
Hematology/Oncology Follow up note [facility] Telephone:(336) [phone] Fax:(336) [phone]   Patient Care Team: Ellamae Sia, MD as PCP - General (Internal Medicine)  REFERRING PROVIDER: [facility] Reason for Visit:  Follow-up for microcytosis  HISTORY OF PRESENTING ILLNESS:  Rebecca Escobar is a  59 y.o.  female with PMH listed below who was referred to me for evaluation of macrocytosis.  Patient follows up with [facility] clinic GI group and has had labs done recently.  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  Reviewed patient's lab results in [place].  MCV has been above 100 since October 2016.  WBC count hemoglobin and platelet levels have been normal. She has Crohn's disease and has been on certolizumab for the past 10 years.  No new medications or herbal supplementations.  No easy bruising or bleeding events.  CT lung scan done in May 2020. # chronic Crohn's disease has been at baseline.  Intermittent diarrhea.  INTERVAL HISTORY Rebecca Escobar is a 59 y.o. female who has above history reviewed by me today presents for follow up visit for macrocytosis.  He has no new complaints today feeling well.  She takes care of her mother who is sick.  Review of Systems  Constitutional: Negative for chills, fever, malaise/fatigue and weight loss.  HENT: Negative for sore throat.   Eyes: Negative for redness.  Respiratory: Negative for cough, shortness of breath and wheezing.   Cardiovascular: Negative for chest pain, palpitations and leg swelling.  Gastrointestinal: Negative for abdominal pain, blood in stool, nausea and vomiting.  Genitourinary: Negative for dysuria.  Musculoskeletal: Negative for myalgias.  Skin: Negative for rash.  Neurological: Negative for dizziness, tingling and tremors.  Endo/Heme/Allergies: Does not bruise/bleed easily.  Psychiatric/Behavioral: Negative for hallucinations.     MEDICAL HISTORY:  Past Medical History:  Diagnosis Date  . Anemia   . Anxiety   . Cervical cancer ([place])   . COPD (chronic obstructive pulmonary disease) ([place])    Pt states she has "some" copd  . Crohn's disease ([place])   . GERD (gastroesophageal reflux disease)   . Tuberculosis    treated    SURGICAL HISTORY: Past Surgical History:  Procedure Laterality Date  . ABDOMINAL HYSTERECTOMY    . CESAREAN SECTION    . COLON SURGERY    . COLONOSCOPY WITH PROPOFOL N/A 03/02/2016   Procedure: COLONOSCOPY WITH PROPOFOL;  Surgeon: Manya Silvas, MD;  Location: [facility] ENDOSCOPY;  Service: Endoscopy;  Laterality: N/A;  . COLONOSCOPY WITH PROPOFOL N/A 11/20/2019   Procedure: COLONOSCOPY WITH PROPOFOL;  Surgeon: Toledo, Benay Pike, MD;  Location: [facility] ENDOSCOPY;  Service: Gastroenterology;  Laterality: N/A;  . ESOPHAGOGASTRODUODENOSCOPY (EGD) WITH PROPOFOL N/A 11/20/2019   Procedure: ESOPHAGOGASTRODUODENOSCOPY (EGD) WITH PROPOFOL;  Surgeon: Toledo, Benay Pike, MD;  Location: [facility] ENDOSCOPY;  Service: Gastroenterology;  Laterality: N/A;    SOCIAL HISTORY: Social History   Socioeconomic History  . Marital status: Divorced    Spouse name: Not on file  . Number of children: Not on file  . Years of education: Not on file  . Highest education level: Not on file  Occupational History  . Not on file  Tobacco Use  . Smoking status: Current Every Day Smoker    Packs/day: 0.50    Types: Cigarettes  . Smokeless tobacco: Never Used  Substance and Sexual Activity  . Alcohol use: No  . Drug use: No  . Sexual activity: Yes  Other Topics Concern  .  Not on file  Social History Narrative  . Not on file   Social Determinants of Health   Financial Resource Strain:   . Difficulty of Paying Living Expenses: Not on file  Food Insecurity:   . Worried About Charity fundraiser in the Last Year: Not on file  . Ran Out of Food in the Last Year: Not on file  Transportation Needs:   . Lack of  Transportation (Medical): Not on file  . Lack of Transportation (Non-Medical): Not on file  Physical Activity:   . Days of Exercise per Week: Not on file  . Minutes of Exercise per Session: Not on file  Stress:   . Feeling of Stress : Not on file  Social Connections:   . Frequency of Communication with Friends and Family: Not on file  . Frequency of Social Gatherings with Friends and Family: Not on file  . Attends Religious Services: Not on file  . Active Member of Clubs or Organizations: Not on file  . Attends Archivist Meetings: Not on file  . Marital Status: Not on file  Intimate Partner Violence:   . Fear of Current or Ex-Partner: Not on file  . Emotionally Abused: Not on file  . Physically Abused: Not on file  . Sexually Abused: Not on file    FAMILY HISTORY: Family History  Problem Relation Age of Onset  . Kidney disease Neg Hx   . Bladder Cancer Neg Hx   . Breast cancer Neg Hx     ALLERGIES:  is allergic to nsaids.  MEDICATIONS:  Current Outpatient Medications  Medication Sig Dispense Refill  . albuterol (PROVENTIL HFA;VENTOLIN HFA) 108 (90 Base) MCG/ACT inhaler Inhale 2 puffs into the lungs every 6 (six) hours as needed for wheezing or shortness of breath. 1 Inhaler 0  . busPIRone (BUSPAR) 5 MG tablet Take 5 mg by mouth 2 (two) times daily.    . Certolizumab Pegol 2 X 200 MG/ML KIT Inject 400 mg into the skin every 30 (thirty) days.    . cyanocobalamin (,VITAMIN B-12,) 1000 MCG/ML injection Inject into the muscle.    Marland Kitchen FLOVENT HFA 110 MCG/ACT inhaler Inhale 2 puffs into the lungs 2 (two) times daily.    . Multiple Vitamin (MULTI-VITAMINS) TABS Take by mouth.    Marland Kitchen omeprazole (PRILOSEC) 20 MG capsule Take 20 mg by mouth daily.    . traMADol (ULTRAM) 50 MG tablet Take by mouth.    . valACYclovir (VALTREX) 1000 MG tablet Take 1,000 mg by mouth daily.    . Vitamin D, Ergocalciferol, (DRISDOL) 1.25 MG (50000 UT) CAPS capsule Take by mouth.     No current  facility-administered medications for this visit.     PHYSICAL EXAMINATION: ECOG PERFORMANCE STATUS: 0 - Asymptomatic Vitals:   02/25/20 1012  BP: (!) 149/83  Pulse: 69  Resp: 16  Temp: (!) 96 F (35.6 C)   Filed Weights   02/25/20 1012  Weight: 158 lb 6.4 oz (71.8 kg)    Physical Exam  Constitutional: She is oriented to person, place, and time. No distress.  Thin  HENT:  Head: Normocephalic and atraumatic.  Nose: Nose normal.  Mouth/Throat: Oropharynx is clear and moist. No oropharyngeal exudate.  Eyes: Pupils are equal, round, and reactive to light. Conjunctivae and EOM are normal. Left eye exhibits no discharge. No scleral icterus.  Cardiovascular: Normal rate, regular rhythm and normal heart sounds.  No murmur heard. Pulmonary/Chest: Effort normal. No respiratory distress. She has no  wheezes. She has no rales. She exhibits no tenderness.  Abdominal: Soft. Bowel sounds are normal. She exhibits no distension and no mass. There is no abdominal tenderness.  Musculoskeletal:        General: No edema. Normal range of motion.     Cervical back: Normal range of motion and neck supple.  Lymphadenopathy:    She has no cervical adenopathy.  Neurological: She is alert and oriented to person, place, and time. No cranial nerve deficit. She exhibits normal muscle tone. Coordination normal.  Skin: Skin is warm and dry. She is not diaphoretic. No erythema.  Psychiatric: Mood and affect normal.     LABORATORY DATA:  I have reviewed the data as listed Lab Results  Component Value Date   WBC 7.6 02/25/2020   HGB 13.8 02/25/2020   HCT 41.9 02/25/2020   MCV 102.2 (H) 02/25/2020   PLT 182 02/25/2020   Recent Labs    04/01/19 1248 07/01/19 1013 02/25/20 0954  NA 140 140 138  K 3.7 3.7 3.8  CL 108 108 103  CO2 24 28 25   GLUCOSE 131* 97 106*  BUN 17 24* 13  CREATININE 0.82 0.64 0.63  CALCIUM 8.8* 8.6* 8.9  GFRNONAA >60 >60 >60  GFRAA >60 >60 >60  PROT 7.0 6.7 6.9    ALBUMIN 3.9 3.5 3.9  AST 17 15 15   ALT 10 9 10   ALKPHOS 80 81 87  BILITOT 0.7 0.6 0.6    Labs from [facility].  TSH 1.651 Ferritin 95 MMA 152 11/01/2017 B12 546 01/08/2018 Homocysteine 20.5 01/17/2018 Folate 5.9   normal copper,  B12 level, , negative flowcytometry, normal LDH, no immature and morphological abnormality on smear review,  ASSESSMENT & PLAN:  1. Macrocytic anemia   2. Lung nodule   3. Tobacco use    #Macrocytosis Labs reviewed and discussed with patient. Previous mild anemia has completely resolved.  Hemoglobin is normal. MCV is chronically elevated. Vitamin B12 level is pending at the time of dictation.  Patient gets vitamin B12 injections through her primary care provider. Folate level is normal. Discussed with patient that for now, she has chronic macrocytosis which can be a drug side effect versus underlying bone marrow dysplasia.  I will hold off bone marrow biopsy at this point given that she has no signs of cytopenia. Continue follow-up and blood work every 6 months.  #Lung nodule, Patient is with lung cancer screening program.  due to have a repeat annual lung cancer screening in May 2021. Smoking cessation.  All questions were answered. The patient knows to call the clinic with any problems questions or concerns.  Return of visit: 6 months. Earlie Server, MD, PhD Hematology Oncology [facility] Pager- [phone] 02/25/2020

## 2020-04-16 ENCOUNTER — Telehealth: Payer: Self-pay | Admitting: *Deleted

## 2020-04-16 DIAGNOSIS — Z122 Encounter for screening for malignant neoplasm of respiratory organs: Secondary | ICD-10-CM

## 2020-04-16 DIAGNOSIS — Z87891 Personal history of nicotine dependence: Secondary | ICD-10-CM

## 2020-04-16 NOTE — Telephone Encounter (Signed)
Received referral for low dose lung cancer screening CT scan. Message left at phone number listed in EMR for patient to call me back to facilitate scheduling scan.  

## 2020-04-17 NOTE — Telephone Encounter (Signed)
Received referral for initial lung cancer screening scan. Contacted patient and obtained smoking history,(current, 30 pack year) as well as answering questions related to screening process. Patient denies signs of lung cancer such as weight loss or hemoptysis. Patient denies comorbidity that would prevent curative treatment if lung cancer were found. Patient is scheduled for shared decision making visit and CT scan on 05/23/20 at 1015am.

## 2020-04-17 NOTE — Addendum Note (Signed)
Addended by: Lieutenant Diego on: 04/17/2020 12:18 PM   Modules accepted: Orders

## 2020-04-22 ENCOUNTER — Other Ambulatory Visit: Payer: Self-pay

## 2020-04-22 ENCOUNTER — Inpatient Hospital Stay: Payer: Medicare Other | Attending: Oncology | Admitting: Oncology

## 2020-04-22 ENCOUNTER — Encounter: Payer: Self-pay | Admitting: Oncology

## 2020-04-22 ENCOUNTER — Ambulatory Visit
Admission: RE | Admit: 2020-04-22 | Discharge: 2020-04-22 | Disposition: A | Discharge status: Home / Self Care | Payer: Medicare Other | Source: Ambulatory Visit | Attending: Oncology | Admitting: Oncology

## 2020-04-22 DIAGNOSIS — Z122 Encounter for screening for malignant neoplasm of respiratory organs: Secondary | ICD-10-CM | POA: Diagnosis present

## 2020-04-22 DIAGNOSIS — Z87891 Personal history of nicotine dependence: Secondary | ICD-10-CM

## 2020-04-22 NOTE — Progress Notes (Signed)
Virtual Visit via Video Note  I connected with Mrs. Weitman on 04/22/20 at 10:15 AM EDT by a video enabled telemedicine application and verified that I am speaking with the correct person using two identifiers.  Location: Patient: [facility] Provider: Home   I discussed the limitations of evaluation and management by telemedicine and the availability of in person appointments. The patient expressed understanding and agreed to proceed.  I discussed the assessment and treatment plan with the patient. The patient was provided an opportunity to ask questions and all were answered. The patient agreed with the plan and demonstrated an understanding of the instructions.   The patient was advised to call back or seek an in-person evaluation if the symptoms worsen or if the condition fails to improve as anticipated.   In accordance with CMS guidelines, patient has met eligibility criteria including age, absence of signs or symptoms of lung cancer.  Social History   Tobacco Use  . Smoking status: Current Every Day Smoker    Packs/day: 0.75    Years: 40.00    Pack years: 30.00    Types: Cigarettes  . Smokeless tobacco: Never Used  Substance Use Topics  . Alcohol use: No  . Drug use: No      A shared decision-making session was conducted prior to the performance of CT scan. This includes one or more decision aids, includes benefits and harms of screening, follow-up diagnostic testing, over-diagnosis, false positive rate, and total radiation exposure.   Counseling on the importance of adherence to annual lung cancer LDCT screening, impact of co-morbidities, and ability or willingness to undergo diagnosis and treatment is imperative for compliance of the program.   Counseling on the importance of continued smoking cessation for former smokers; the importance of smoking cessation for current smokers, and information about tobacco cessation interventions have been given to patient including [place] and [facility] quit [place] programs.   Written order for lung cancer screening with LDCT has been given to the patient and any and all questions have been answered to the best of my abilities.    Yearly follow up will be coordinated by Burgess Estelle, Thoracic Navigator.  I provided 15 minutes of face-to-face video visit time during this encounter, and > 50% was spent counseling as documented under my assessment & plan.   Jacquelin Hawking, NP

## 2020-04-23 ENCOUNTER — Encounter: Payer: Self-pay | Admitting: *Deleted

## 2020-07-28 ENCOUNTER — Other Ambulatory Visit: Payer: Self-pay | Admitting: Internal Medicine

## 2020-07-28 DIAGNOSIS — Z1231 Encounter for screening mammogram for malignant neoplasm of breast: Secondary | ICD-10-CM

## 2020-08-13 ENCOUNTER — Ambulatory Visit
Admission: RE | Admit: 2020-08-13 | Discharge: 2020-08-13 | Disposition: A | Discharge status: Home / Self Care | Payer: Medicare Other | Source: Ambulatory Visit | Attending: Internal Medicine | Admitting: Internal Medicine

## 2020-08-13 ENCOUNTER — Other Ambulatory Visit: Payer: Self-pay

## 2020-08-13 DIAGNOSIS — Z1231 Encounter for screening mammogram for malignant neoplasm of breast: Secondary | ICD-10-CM | POA: Diagnosis present

## 2020-08-26 ENCOUNTER — Inpatient Hospital Stay: Payer: Medicare Other | Admitting: Oncology

## 2020-08-26 ENCOUNTER — Inpatient Hospital Stay: Payer: Medicare Other

## 2020-08-28 ENCOUNTER — Inpatient Hospital Stay (HOSPITAL_BASED_OUTPATIENT_CLINIC_OR_DEPARTMENT_OTHER): Payer: Medicare Other | Admitting: Oncology

## 2020-08-28 ENCOUNTER — Encounter: Payer: Self-pay | Admitting: Oncology

## 2020-08-28 ENCOUNTER — Inpatient Hospital Stay: Payer: Medicare Other | Attending: Oncology

## 2020-08-28 ENCOUNTER — Other Ambulatory Visit: Payer: Self-pay

## 2020-08-28 VITALS — BP 130/84 | HR 74 | Temp 96.2°F | Resp 16 | Wt 159.6 lb

## 2020-08-28 DIAGNOSIS — D539 Nutritional anemia, unspecified: Secondary | ICD-10-CM

## 2020-08-28 DIAGNOSIS — D7589 Other specified diseases of blood and blood-forming organs: Secondary | ICD-10-CM | POA: Insufficient documentation

## 2020-08-28 DIAGNOSIS — F17210 Nicotine dependence, cigarettes, uncomplicated: Secondary | ICD-10-CM | POA: Diagnosis not present

## 2020-08-28 LAB — COMPREHENSIVE METABOLIC PANEL
ALT: 9 U/L (ref 0–44)
AST: 12 U/L — ABNORMAL LOW (ref 15–41)
Albumin: 3.9 g/dL (ref 3.5–5.0)
Alkaline Phosphatase: 94 U/L (ref 38–126)
Anion gap: 9 (ref 5–15)
BUN: 13 mg/dL (ref 8–23)
CO2: 29 mmol/L (ref 22–32)
Calcium: 8.8 mg/dL — ABNORMAL LOW (ref 8.9–10.3)
Chloride: 103 mmol/L (ref 98–111)
Creatinine, Ser: 0.67 mg/dL (ref 0.44–1.00)
GFR calc Af Amer: >60 mL/min (ref >60)
GFR calc non Af Amer: >60 mL/min (ref >60)
Glucose, Bld: 79 mg/dL (ref 70–99)
Potassium: 4.3 mmol/L (ref 3.5–5.1)
Sodium: 141 mmol/L (ref 135–145)
Total Bilirubin: 0.5 mg/dL (ref 0.3–1.2)
Total Protein: 7 g/dL (ref 6.5–8.1)

## 2020-08-28 LAB — CBC WITH DIFFERENTIAL/PLATELET
Abs Immature Granulocytes: 0.01 10*3/uL (ref 0.00–0.07)
Basophils Absolute: 0.1 10*3/uL (ref 0.0–0.1)
Basophils Relative: 1 %
Eosinophils Absolute: 0.2 10*3/uL (ref 0.0–0.5)
Eosinophils Relative: 3 %
HCT: 41.5 % (ref 36.0–46.0)
Hemoglobin: 14.2 g/dL (ref 12.0–15.0)
Immature Granulocytes: 0 %
Lymphocytes Relative: 49 %
Lymphs Abs: 3.6 10*3/uL (ref 0.7–4.0)
MCH: 35 pg — ABNORMAL HIGH (ref 26.0–34.0)
MCHC: 34.2 g/dL (ref 30.0–36.0)
MCV: 102.2 fL — ABNORMAL HIGH (ref 80.0–100.0)
Monocytes Absolute: 0.5 10*3/uL (ref 0.1–1.0)
Monocytes Relative: 7 %
Neutro Abs: 2.9 10*3/uL (ref 1.7–7.7)
Neutrophils Relative %: 40 %
Platelets: 183 10*3/uL (ref 150–400)
RBC: 4.06 MIL/uL (ref 3.87–5.11)
RDW: 13.1 % (ref 11.5–15.5)
WBC: 7.3 10*3/uL (ref 4.0–10.5)
nRBC: 0 % (ref 0.0–0.2)

## 2020-08-28 LAB — VITAMIN B12: Vitamin B-12: 452 pg/mL (ref 180–914)

## 2020-08-28 LAB — FOLATE: Folate: 6.6 ng/mL (ref >5.9)

## 2020-08-28 NOTE — Progress Notes (Signed)
Patient denies new problems/concerns today.   °

## 2020-08-28 NOTE — Progress Notes (Signed)
Hematology/Oncology Follow up note [facility] [phone] [phone]   Patient Care Team: Ellamae Sia, MD as PCP - General (Internal Medicine)  REFERRING PROVIDER: [facility] Reason for Visit:  Follow-up for microcytosis  HISTORY OF PRESENTING ILLNESS:  Rebecca Escobar is a  59 y.o.  female with PMH listed below who was referred to me for evaluation of macrocytosis.  Patient follows up with [facility] GI group and has had labs done recently.  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  Reviewed patient's lab results in [place].  MCV has been above 100 since October 2016.  WBC count hemoglobin and platelet levels have been normal. She has Crohn's disease and has been on certolizumab for the past 10 years.  No new medications or herbal supplementations.  No easy bruising or bleeding events.  CT lung scan done in May 2020. # chronic Crohn's disease has been at baseline.  Intermittent diarrhea.  INTERVAL HISTORY Rebecca Escobar is a 59 y.o. female who has above history reviewed by me today presents for follow up visit for macrocytosis.  Patient reports feeling well. She has no new complaints. She gets vitamin B12 injections monthly.    Review of Systems  Constitutional: Negative for chills, fever, malaise/fatigue and weight loss.  HENT: Negative for sore throat.   Eyes: Negative for redness.  Respiratory: Negative for cough, shortness of breath and wheezing.   Cardiovascular: Negative for chest pain, palpitations and leg swelling.  Gastrointestinal: Negative for abdominal pain, blood in stool, nausea and vomiting.  Genitourinary: Negative for dysuria.  Musculoskeletal: Negative for myalgias.  Skin: Negative for rash.  Neurological: Negative for dizziness, tingling and tremors.  Endo/Heme/Allergies: Does not bruise/bleed easily.  Psychiatric/Behavioral: Negative for  hallucinations.    MEDICAL HISTORY:  Past Medical History:  Diagnosis Date  . Anemia   . Anxiety   . Cervical cancer ([place])   . COPD (chronic obstructive pulmonary disease) ([place])    Pt states she has "some" copd  . Crohn's disease ([place])   . GERD (gastroesophageal reflux disease)   . Tuberculosis    treated    SURGICAL HISTORY: Past Surgical History:  Procedure Laterality Date  . ABDOMINAL HYSTERECTOMY    . CESAREAN SECTION    . COLON SURGERY    . COLONOSCOPY WITH PROPOFOL N/A 03/02/2016   Procedure: COLONOSCOPY WITH PROPOFOL;  Surgeon: Manya Silvas, MD;  Location: [facility] ENDOSCOPY;  Service: Endoscopy;  Laterality: N/A;  . COLONOSCOPY WITH PROPOFOL N/A 11/20/2019   Procedure: COLONOSCOPY WITH PROPOFOL;  Surgeon: Toledo, Benay Pike, MD;  Location: [facility] ENDOSCOPY;  Service: Gastroenterology;  Laterality: N/A;  . ESOPHAGOGASTRODUODENOSCOPY (EGD) WITH PROPOFOL N/A 11/20/2019   Procedure: ESOPHAGOGASTRODUODENOSCOPY (EGD) WITH PROPOFOL;  Surgeon: Toledo, Benay Pike, MD;  Location: [facility] ENDOSCOPY;  Service: Gastroenterology;  Laterality: N/A;    SOCIAL HISTORY: Social History   Socioeconomic History  . Marital status: Divorced    Spouse name: Not on file  . Number of children: Not on file  . Years of education: Not on file  . Highest education level: Not on file  Occupational History  . Not on file  Tobacco Use  . Smoking status: Current Every Day Smoker    Packs/day: 0.75    Years: 40.00    Pack years: 30.00    Types: Cigarettes  . Smokeless tobacco: Never Used  Vaping Use  . Vaping Use: Never used  Substance and Sexual Activity  .  Alcohol use: No  . Drug use: No  . Sexual activity: Yes  Other Topics Concern  . Not on file  Social History Narrative  . Not on file   Social Determinants of Health   Financial Resource Strain:   . Difficulty of Paying Living Expenses: Not on file  Food Insecurity:   . Worried About Charity fundraiser in the Last Year: Not on file    . Ran Out of Food in the Last Year: Not on file  Transportation Needs:   . Lack of Transportation (Medical): Not on file  . Lack of Transportation (Non-Medical): Not on file  Physical Activity:   . Days of Exercise per Week: Not on file  . Minutes of Exercise per Session: Not on file  Stress:   . Feeling of Stress : Not on file  Social Connections:   . Frequency of Communication with Friends and Family: Not on file  . Frequency of Social Gatherings with Friends and Family: Not on file  . Attends Religious Services: Not on file  . Active Member of Clubs or Organizations: Not on file  . Attends Archivist Meetings: Not on file  . Marital Status: Not on file  Intimate Partner Violence:   . Fear of Current or Ex-Partner: Not on file  . Emotionally Abused: Not on file  . Physically Abused: Not on file  . Sexually Abused: Not on file    FAMILY HISTORY: Family History  Problem Relation Age of Onset  . Kidney disease Neg Hx   . Bladder Cancer Neg Hx   . Breast cancer Neg Hx     ALLERGIES:  is allergic to nsaids.  MEDICATIONS:  Current Outpatient Medications  Medication Sig Dispense Refill  . albuterol (PROVENTIL HFA;VENTOLIN HFA) 108 (90 Base) MCG/ACT inhaler Inhale 2 puffs into the lungs every 6 (six) hours as needed for wheezing or shortness of breath. 1 Inhaler 0  . busPIRone (BUSPAR) 5 MG tablet Take 5 mg by mouth 2 (two) times daily.    . Certolizumab Pegol 2 X 200 MG/ML KIT Inject 400 mg into the skin every 30 (thirty) days.    . cyanocobalamin (,VITAMIN B-12,) 1000 MCG/ML injection Inject into the muscle.    Marland Kitchen FLOVENT HFA 110 MCG/ACT inhaler Inhale 2 puffs into the lungs 2 (two) times daily.    . Multiple Vitamin (MULTI-VITAMINS) TABS Take by mouth.    Marland Kitchen omeprazole (PRILOSEC) 20 MG capsule Take 20 mg by mouth daily.    . traMADol (ULTRAM) 50 MG tablet Take by mouth.    . valACYclovir (VALTREX) 1000 MG tablet Take 1,000 mg by mouth daily.    . Vitamin D,  Ergocalciferol, (DRISDOL) 1.25 MG (50000 UT) CAPS capsule Take by mouth.     No current facility-administered medications for this visit.     PHYSICAL EXAMINATION: ECOG PERFORMANCE STATUS: 0 - Asymptomatic Vitals:   08/28/20 0937  BP: 130/84  Pulse: 74  Resp: 16  Temp: (!) 96.2 F (35.7 C)   Filed Weights   08/28/20 0937  Weight: 159 lb 9.6 oz (72.4 kg)    Physical Exam Constitutional:      General: She is not in acute distress.    Appearance: She is not diaphoretic.     Comments: Thin  HENT:     Head: Normocephalic and atraumatic.     Nose: Nose normal.     Mouth/Throat:     Pharynx: No oropharyngeal exudate.  Eyes:  General: No scleral icterus.       Left eye: No discharge.     Conjunctiva/sclera: Conjunctivae normal.     Pupils: Pupils are equal, round, and reactive to light.  Cardiovascular:     Rate and Rhythm: Normal rate and regular rhythm.     Heart sounds: Normal heart sounds. No murmur heard.   Pulmonary:     Effort: Pulmonary effort is normal. No respiratory distress.     Breath sounds: No wheezing or rales.  Chest:     Chest wall: No tenderness.  Abdominal:     General: Bowel sounds are normal. There is no distension.     Palpations: Abdomen is soft. There is no mass.     Tenderness: There is no abdominal tenderness.  Musculoskeletal:        General: Normal range of motion.     Cervical back: Normal range of motion and neck supple.  Lymphadenopathy:     Cervical: No cervical adenopathy.  Skin:    General: Skin is warm and dry.     Findings: No erythema.  Neurological:     Mental Status: She is alert and oriented to person, place, and time.     Cranial Nerves: No cranial nerve deficit.     Motor: No abnormal muscle tone.     Coordination: Coordination normal.  Psychiatric:        Mood and Affect: Mood and affect normal.      LABORATORY DATA:  I have reviewed the data as listed Lab Results  Component Value Date   WBC 7.3 08/28/2020     HGB 14.2 08/28/2020   HCT 41.5 08/28/2020   MCV 102.2 (H) 08/28/2020   PLT 183 08/28/2020   Recent Labs    02/25/20 0954 08/28/20 0919  NA 138 141  K 3.8 4.3  CL 103 103  CO2 25 29  GLUCOSE 106* 79  BUN 13 13  CREATININE 0.63 0.67  CALCIUM 8.9 8.8*  GFRNONAA >60 >60  GFRAA >60 >60  PROT 6.9 7.0  ALBUMIN 3.9 3.9  AST 15 12*  ALT 10 9  ALKPHOS 87 94  BILITOT 0.6 0.5    Labs from [facility].  TSH 1.651 Ferritin 95 MMA 152 11/01/2017 B12 546 01/08/2018 Homocysteine 20.5 01/17/2018 Folate 5.9   normal copper,  B12 level, , negative flowcytometry, normal LDH, no immature and morphological abnormality on smear review,  ASSESSMENT & PLAN:  1. Macrocytic anemia    #Macrocytosis Labs are reviewed and discussed with patient. Hemoglobin, white blood cell count and platelet counts are all stable. Chronically elevated MCV with normal vitamin B12 level and folate level Continue vitamin B12 injections through her primary care provider.  Discussed with patient that chronic macrocytosis may be secondary to her medications. I cannot rule out possibility of early process of MDS. Given that her counts are stable I will hold off for biopsy and continue watchful waiting. Patient agrees with the plan.  Tobacco use, patient has been started on lung cancer screening.  I will see patient in 1 year. Recommend patient continue follow-up with primary care provider.  All questions were answered. The patient knows to call the clinic with any problems questions or concerns.  Earlie Server, MD, PhD Hematology Oncology [facility] Pager- [phone] 08/28/2020

## 2021-01-14 ENCOUNTER — Ambulatory Visit: Payer: Medicare Other | Admitting: Dermatology

## 2021-01-18 ENCOUNTER — Ambulatory Visit: Payer: Medicare Other | Admitting: Dermatology

## 2021-02-17 ENCOUNTER — Encounter: Payer: Self-pay | Admitting: Oncology

## 2021-04-06 ENCOUNTER — Telehealth: Payer: Self-pay | Admitting: *Deleted

## 2021-04-06 DIAGNOSIS — Z87891 Personal history of nicotine dependence: Secondary | ICD-10-CM

## 2021-04-06 DIAGNOSIS — F17200 Nicotine dependence, unspecified, uncomplicated: Secondary | ICD-10-CM

## 2021-04-06 DIAGNOSIS — Z122 Encounter for screening for malignant neoplasm of respiratory organs: Secondary | ICD-10-CM

## 2021-04-06 NOTE — Telephone Encounter (Signed)
Spoke to patient via telephone re: annual lung screening scan. Confirmed smoking history, current smoker, .75 ppd x 41 yrs. Scan scheduled for 04/23/21 @ 3:00 pm.

## 2021-04-08 ENCOUNTER — Encounter: Payer: Self-pay | Admitting: Emergency Medicine

## 2021-04-08 ENCOUNTER — Emergency Department
Admission: EM | Admit: 2021-04-08 | Discharge: 2021-04-08 | Disposition: A | Discharge status: Home / Self Care | Payer: Medicare Other | Attending: Emergency Medicine | Admitting: Emergency Medicine

## 2021-04-08 ENCOUNTER — Other Ambulatory Visit: Payer: Self-pay

## 2021-04-08 DIAGNOSIS — R300 Dysuria: Secondary | ICD-10-CM | POA: Diagnosis not present

## 2021-04-08 DIAGNOSIS — F17210 Nicotine dependence, cigarettes, uncomplicated: Secondary | ICD-10-CM | POA: Diagnosis not present

## 2021-04-08 DIAGNOSIS — M542 Cervicalgia: Secondary | ICD-10-CM | POA: Insufficient documentation

## 2021-04-08 DIAGNOSIS — J449 Chronic obstructive pulmonary disease, unspecified: Secondary | ICD-10-CM | POA: Insufficient documentation

## 2021-04-08 DIAGNOSIS — R202 Paresthesia of skin: Secondary | ICD-10-CM | POA: Insufficient documentation

## 2021-04-08 DIAGNOSIS — R350 Frequency of micturition: Secondary | ICD-10-CM | POA: Insufficient documentation

## 2021-04-08 DIAGNOSIS — Z8541 Personal history of malignant neoplasm of cervix uteri: Secondary | ICD-10-CM | POA: Diagnosis not present

## 2021-04-08 DIAGNOSIS — N3090 Cystitis, unspecified without hematuria: Secondary | ICD-10-CM

## 2021-04-08 DIAGNOSIS — Z7951 Long term (current) use of inhaled steroids: Secondary | ICD-10-CM | POA: Diagnosis not present

## 2021-04-08 LAB — URINALYSIS, COMPLETE (UACMP) WITH MICROSCOPIC
Bacteria, UA: NONE SEEN
Bilirubin Urine: NEGATIVE
Glucose, UA: NEGATIVE mg/dL
Hgb urine dipstick: NEGATIVE
Ketones, ur: 5 mg/dL — AB
Leukocytes,Ua: NEGATIVE
Nitrite: NEGATIVE
Protein, ur: NEGATIVE mg/dL
Specific Gravity, Urine: 1.031 — ABNORMAL HIGH (ref 1.005–1.030)
pH: 5 (ref 5.0–8.0)

## 2021-04-08 LAB — CBC
HCT: 40.4 % (ref 36.0–46.0)
Hemoglobin: 13.5 g/dL (ref 12.0–15.0)
MCH: 33.8 pg (ref 26.0–34.0)
MCHC: 33.4 g/dL (ref 30.0–36.0)
MCV: 101.3 fL — ABNORMAL HIGH (ref 80.0–100.0)
Platelets: 174 10*3/uL (ref 150–400)
RBC: 3.99 MIL/uL (ref 3.87–5.11)
RDW: 13.6 % (ref 11.5–15.5)
WBC: 8 10*3/uL (ref 4.0–10.5)
nRBC: 0 % (ref 0.0–0.2)

## 2021-04-08 LAB — TROPONIN I (HIGH SENSITIVITY)
Troponin I (High Sensitivity): 3 ng/L (ref <18)
Troponin I (High Sensitivity): 4 ng/L (ref <18)

## 2021-04-08 LAB — BASIC METABOLIC PANEL
Anion gap: 7 (ref 5–15)
BUN: 16 mg/dL (ref 8–23)
CO2: 27 mmol/L (ref 22–32)
Calcium: 8.7 mg/dL — ABNORMAL LOW (ref 8.9–10.3)
Chloride: 105 mmol/L (ref 98–111)
Creatinine, Ser: 0.83 mg/dL (ref 0.44–1.00)
GFR, Estimated: >60 mL/min (ref >60)
Glucose, Bld: 81 mg/dL (ref 70–99)
Potassium: 3.9 mmol/L (ref 3.5–5.1)
Sodium: 139 mmol/L (ref 135–145)

## 2021-04-08 MED ORDER — FOSFOMYCIN TROMETHAMINE 3 G PO PACK
3.0000 g | PACK | Freq: Once | ORAL | Status: AC
  Administered 2021-04-08: 3 g via ORAL
  Filled 2021-04-08: qty 3

## 2021-04-08 MED ORDER — PHENAZOPYRIDINE HCL 200 MG PO TABS
200.0000 mg | ORAL_TABLET | Freq: Once | ORAL | Status: AC
  Administered 2021-04-08: 200 mg via ORAL
  Filled 2021-04-08: qty 1

## 2021-04-08 NOTE — ED Provider Notes (Signed)
[facility] Emergency Department Provider Note  Time seen: 9:17 PM  I have reviewed the triage vital signs and the nursing notes.   HISTORY  Chief Complaint Urinary Tract Infection and Neck Pain   HPI Rebecca Escobar is a 60 y.o. female with a past medical history of anemia, anxiety, COPD, Crohn's, gastric reflux, presents to the emergency department for evaluation of urinary discomfort.  According to the patient over the past 3 to 4 days she has been experiencing urinary frequency and urgency however when she urinates she states only drips come out which can be somewhat uncomfortable at times.  Patient states a history of urinary tract infections which this feels identical per patient.  Denies any vaginal bleeding or discharge.  Denies any abdominal pain.  No fever.   As a secondary complaint the patient states for the past for 5 weeks she has been experiencing pain in her left neck shooting down her left arm at times if she moves her neck or turns her neck in a certain direction.  Patient states she went to the urgent care approximately 3 weeks ago for this and they gave her steroid taper but she states that did not help.  Patient states it is not a primary complaint she only brought it up because she is here for her urinary issue.  Past Medical History:  Diagnosis Date  . Anemia   . Anxiety   . Cervical cancer ([place])   . COPD (chronic obstructive pulmonary disease) ([place])    Pt states she has "some" copd  . Crohn's disease ([place])   . GERD (gastroesophageal reflux disease)   . Tuberculosis    treated    Patient Active Problem List   Diagnosis Date Noted  . Macrocytic anemia 11/28/2019  . Lung nodule 04/02/2019  . Osteopenia of multiple sites 01/01/2019  . Vitamin D deficiency 01/01/2019  . Crohn's disease ([address]) 08/22/2017  . Crohn's disease of small and large intestines ([place]) 04/17/2014    Past Surgical History:  Procedure Laterality Date  . ABDOMINAL  HYSTERECTOMY    . CESAREAN SECTION    . COLON SURGERY    . COLONOSCOPY WITH PROPOFOL N/A 03/02/2016   Procedure: COLONOSCOPY WITH PROPOFOL;  Surgeon: Manya Silvas, MD;  Location: [facility] ENDOSCOPY;  Service: Endoscopy;  Laterality: N/A;  . COLONOSCOPY WITH PROPOFOL N/A 11/20/2019   Procedure: COLONOSCOPY WITH PROPOFOL;  Surgeon: Toledo, Benay Pike, MD;  Location: [facility] ENDOSCOPY;  Service: Gastroenterology;  Laterality: N/A;  . ESOPHAGOGASTRODUODENOSCOPY (EGD) WITH PROPOFOL N/A 11/20/2019   Procedure: ESOPHAGOGASTRODUODENOSCOPY (EGD) WITH PROPOFOL;  Surgeon: Toledo, Benay Pike, MD;  Location: [facility] ENDOSCOPY;  Service: Gastroenterology;  Laterality: N/A;    Prior to Admission medications   Medication Sig Start Date End Date Taking? Authorizing Provider  albuterol (PROVENTIL HFA;VENTOLIN HFA) 108 (90 Base) MCG/ACT inhaler Inhale 2 puffs into the lungs every 6 (six) hours as needed for wheezing or shortness of breath. 01/28/18   Laban Emperor, PA-C  busPIRone (BUSPAR) 5 MG tablet Take 5 mg by mouth 2 (two) times daily. 02/03/20   [provider]  Certolizumab Pegol 2 X 200 MG/ML KIT Inject 400 mg into the skin every 30 (thirty) days.    [provider]  cyanocobalamin (,VITAMIN B-12,) 1000 MCG/ML injection Inject into the muscle. 05/18/16   [provider]  FLOVENT HFA 110 MCG/ACT inhaler Inhale 2 puffs into the lungs 2 (two) times daily. 10/30/19   [provider]  Multiple Vitamin (MULTI-VITAMINS) TABS  Take by mouth.    [provider]  omeprazole (PRILOSEC) 20 MG capsule Take 20 mg by mouth daily.    [provider]  traMADol (ULTRAM) 50 MG tablet Take by mouth. 01/01/16   [provider]  valACYclovir (VALTREX) 1000 MG tablet Take 1,000 mg by mouth daily. 09/13/19   [provider]  Vitamin D, Ergocalciferol, (DRISDOL) 1.25 MG (50000 UT) CAPS capsule Take by mouth. 01/02/19   [provider]    Allergies  Allergen  Reactions  . Nsaids     Due to Crohns disease    Family History  Problem Relation Age of Onset  . Kidney disease Neg Hx   . Bladder Cancer Neg Hx   . Breast cancer Neg Hx     Social History Social History   Tobacco Use  . Smoking status: Current Every Day Smoker    Packs/day: 0.75    Years: 40.00    Pack years: 30.00    Types: Cigarettes  . Smokeless tobacco: Never Used  Vaping Use  . Vaping Use: Never used  Substance Use Topics  . Alcohol use: No  . Drug use: No    Review of Systems Constitutional: Negative for fever Cardiovascular: Negative for chest pain. Respiratory: Negative for shortness of breath. Gastrointestinal: Negative for abdominal pain Musculoskeletal: Left neck pain occasionally shooting down the left arm. Neurological: Negative for headache All other ROS negative  ____________________________________________   PHYSICAL EXAM:  VITAL SIGNS: ED Triage Vitals  Enc Vitals Group     BP 04/08/21 1943 (!) 153/97     Pulse Rate 04/08/21 1943 77     Resp 04/08/21 1943 18     Temp 04/08/21 1943 98.2 F (36.8 C)     Temp Source 04/08/21 1943 Oral     SpO2 04/08/21 1943 94 %     Weight 04/08/21 1944 160 lb (72.6 kg)     Height 04/08/21 1944 5' 7"  (1.702 m)     Head Circumference --      Peak Flow --      Pain Score 04/08/21 1943 8     Pain Loc --      Pain Edu? --      Excl. in [place]? --     Constitutional: Alert and oriented. Well appearing and in no distress. Eyes: Normal exam ENT      Head: Normocephalic and atraumatic.      Mouth/Throat: Mucous membranes are moist. Cardiovascular: Normal rate, regular rhythm. Respiratory: Normal respiratory effort without tachypnea nor retractions. Breath sounds are clear Gastrointestinal: Soft and nontender. No distention. Musculoskeletal: Nontender C-spine.  No paraspinal tenderness.  Great range of motion left upper extremity. Neurologic:  Normal speech and language. No gross focal neurologic  deficits Skin:  Skin is warm, dry and intact.  Psychiatric: Mood and affect are normal.  ____________________________________________    EKG  EKG viewed and interpreted by myself shows a normal sinus rhythm at 80 bpm with a narrow QRS, normal axis, normal intervals, no concerning ST changes.  ____________________________________________   INITIAL IMPRESSION / ASSESSMENT AND PLAN / ED COURSE  Pertinent labs & imaging results that were available during my care of the patient were reviewed by me and considered in my medical decision making (see chart for details).   Patient presents to the emergency department with urinary complaints.  States she is been experiencing some urinary urgency and frequency but then only small amount of urine will come out and she feels a  sensation of dysuria when this occurs.  States that history of urinary tract infections in the past which this feels identical.  We will obtain a bladder scan to evaluate for possible retention issues.  We will send a urine culture.  Given the patient's history of similar symptoms with UTIs in the past we will treat with a one-time dose of fosfomycin and put the patient on Pyridium to help with her symptoms.  Patient states she does occasionally have pain shooting down her left arm with certain movements in her neck.  States it is more of a tingling sensation at times.  I discussed with the patient she needs to follow-up with orthopedics as soon as possible for further evaluation this could be a sign of a nerve impingement.  Patient agreeable plan of care we will refer to orthopedics.  Rebecca Escobar was evaluated in Emergency Department on 04/08/2021 for the symptoms described in the history of present illness. She was evaluated in the context of the global COVID-19 pandemic, which necessitated consideration that the patient might be at risk for infection with the SARS-CoV-2 virus that causes COVID-19. Institutional protocols and  algorithms that pertain to the evaluation of patients at risk for COVID-19 are in a state of rapid change based on information released by regulatory bodies including the CDC and federal and state organizations. These policies and algorithms were followed during the patient's care in the ED.  ____________________________________________   FINAL CLINICAL IMPRESSION(S) / ED DIAGNOSES  Dysuria    Harvest Dark, MD 04/08/21 2313

## 2021-04-08 NOTE — ED Triage Notes (Addendum)
Pt arrived via POV with reports of UTI sxs that started 3 days ago, pt c/o suprapubic pain and pressure and states she has had decreased urination denies any fevers. Pt does report some right side back pain as well.  Pt also c/o neck pain x 1 month.

## 2021-04-08 NOTE — ED Notes (Signed)
Pt agreeable with d/c plan as discussed by provider- this nurse has verbally reinforced d/c instructions and provided written copy - pt acknowledges verbal understanding and denies any additional questions, concerns, needs. Ambulatory independently at discharge with steady gait -  No acute distress.

## 2021-04-08 NOTE — ED Notes (Signed)
ED provider now at bedside

## 2021-04-08 NOTE — ED Notes (Signed)
Patient hasn't voided since arrival --  After bladder scan showing55m  pt reports felt pressure and felt like she needed to void but was not able to -- states pressure, however, has resolved and she only feels bloated -- educated pt to notify me if she does pass urine and will repeat bladder scan

## 2021-04-10 LAB — URINE CULTURE: Culture: NO GROWTH

## 2021-04-23 ENCOUNTER — Ambulatory Visit
Admission: RE | Admit: 2021-04-23 | Discharge: 2021-04-23 | Disposition: A | Discharge status: Home / Self Care | Payer: Medicare Other | Source: Ambulatory Visit | Attending: Nurse Practitioner | Admitting: Nurse Practitioner

## 2021-04-23 ENCOUNTER — Other Ambulatory Visit: Payer: Self-pay

## 2021-04-23 DIAGNOSIS — F17200 Nicotine dependence, unspecified, uncomplicated: Secondary | ICD-10-CM

## 2021-04-23 DIAGNOSIS — Z122 Encounter for screening for malignant neoplasm of respiratory organs: Secondary | ICD-10-CM | POA: Diagnosis present

## 2021-04-23 DIAGNOSIS — Z87891 Personal history of nicotine dependence: Secondary | ICD-10-CM | POA: Diagnosis not present

## 2021-04-27 ENCOUNTER — Encounter: Payer: Self-pay | Admitting: *Deleted

## 2021-06-10 ENCOUNTER — Other Ambulatory Visit: Payer: Self-pay

## 2021-06-10 ENCOUNTER — Ambulatory Visit (INDEPENDENT_AMBULATORY_CARE_PROVIDER_SITE_OTHER): Payer: Medicare Other | Admitting: Dermatology

## 2021-06-10 DIAGNOSIS — C44722 Squamous cell carcinoma of skin of right lower limb, including hip: Secondary | ICD-10-CM

## 2021-06-10 DIAGNOSIS — C4492 Squamous cell carcinoma of skin, unspecified: Secondary | ICD-10-CM

## 2021-06-10 DIAGNOSIS — L578 Other skin changes due to chronic exposure to nonionizing radiation: Secondary | ICD-10-CM

## 2021-06-10 DIAGNOSIS — D492 Neoplasm of unspecified behavior of bone, soft tissue, and skin: Secondary | ICD-10-CM

## 2021-06-10 HISTORY — DX: Squamous cell carcinoma of skin, unspecified: C44.92

## 2021-06-10 NOTE — Progress Notes (Signed)
   New Patient Visit  Subjective  Rebecca Escobar is a 60 y.o. female who presents for the following: check spot (R lower leg, >36m painful).  The following portions of the chart were reviewed this encounter and updated as appropriate:   Tobacco  Allergies  Meds  Problems  Med Hx  Surg Hx  Fam Hx      Review of Systems:  No other skin or systemic complaints except as noted in HPI or Assessment and Plan.  Objective  Well appearing patient in no apparent distress; mood and affect are within normal limits.  A focused examination was performed including right leg. Relevant physical exam findings are noted in the Assessment and Plan.  R proximal pretibial 0.9cc hyperkeratotic pap   Assessment & Plan  Neoplasm of skin R proximal pretibial  Epidermal / dermal shaving  Lesion diameter (cm):  0.9 Informed consent: discussed and consent obtained   Timeout: patient name, date of birth, surgical site, and procedure verified   Procedure prep:  Patient was prepped and draped in usual sterile fashion Prep type:  Isopropyl alcohol Anesthesia: the lesion was anesthetized in a standard fashion   Anesthetic:  1% lidocaine w/ epinephrine 1-100,000 buffered w/ 8.4% NaHCO3 Instrument used: flexible razor blade   Hemostasis achieved with: pressure, aluminum chloride and electrodesiccation   Outcome: patient tolerated procedure well   Post-procedure details: sterile dressing applied and wound care instructions given   Dressing type: bandage and bacitracin    Destruction of lesion Complexity: extensive   Destruction method: electrodesiccation and curettage   Informed consent: discussed and consent obtained   Timeout:  patient name, date of birth, surgical site, and procedure verified Procedure prep:  Patient was prepped and draped in usual sterile fashion Prep type:  Isopropyl alcohol Anesthesia: the lesion was anesthetized in a standard fashion   Anesthetic:  1% lidocaine w/  epinephrine 1-100,000 buffered w/ 8.4% NaHCO3 Curettage performed in three different directions: Yes   Electrodesiccation performed over the curetted area: Yes   Lesion length (cm):  0.9 Lesion width (cm):  0.9 Margin per side (cm):  0.2 Final wound size (cm):  1.3 Hemostasis achieved with:  pressure, aluminum chloride and electrodesiccation Outcome: patient tolerated procedure well with no complications   Post-procedure details: sterile dressing applied and wound care instructions given   Dressing type: bandage and bacitracin    Specimen 1 - Surgical pathology Differential Diagnosis: D48.5 R/O SCC  Check Margins: No 0.9cc hyperkeratotic pap EDC today  Actinic Damage - chronic, secondary to cumulative UV radiation exposure/sun exposure over time - diffuse scaly erythematous macules with underlying dyspigmentation - Recommend daily broad spectrum sunscreen SPF 30+ to sun-exposed areas, reapply every 2 hours as needed.  - Recommend staying in the shade or wearing long sleeves, sun glasses (UVA+UVB protection) and wide brim hats (4-inch brim around the entire circumference of the hat). - Call for new or changing lesions.  Return in about 8 months (around 02/10/2022) for TBSE.  I, SOthelia Pulling RMA, am acting as scribe for DSarina Ser MD . Documentation: I have reviewed the above documentation for accuracy and completeness, and I agree with the above.  DSarina Ser MD

## 2021-06-10 NOTE — Patient Instructions (Addendum)
If you have any questions or concerns for your doctor, please call our main line at [phone] and press option 4 to reach your doctor's medical assistant. If no one answers, please leave a voicemail as directed and we will return your call as soon as possible. Messages left after 4 pm will be answered the following business day.   You may also send us a message via MyChart. We typically respond to MyChart messages within 1-2 business days.  For prescription refills, please ask your pharmacy to contact our office. Our fax number is [phone].  If you have an urgent issue when the clinic is closed that cannot wait until the next business day, you can page your doctor at the number below.    Please note that while we do our best to be available for urgent issues outside of office hours, we are not available 24/7.   If you have an urgent issue and are unable to reach us, you may choose to seek medical care at your doctor's office, retail clinic, urgent care center, or emergency room.  If you have a medical emergency, please immediately call 911 or go to the emergency department.  Pager Numbers  - Dr. Kowalski: [phone]  - Dr. Moye: [phone]  - Dr. Stewart: [phone]  In the event of inclement weather, please call our main line at [phone] for an update on the status of any delays or closures.  Dermatology Medication Tips: Please keep the boxes that topical medications come in in order to help keep track of the instructions about where and how to use these. Pharmacies typically print the medication instructions only on the boxes and not directly on the medication tubes.   If your medication is too expensive, please contact our office at [phone] option 4 or send us a message through MyChart.   We are unable to tell what your co-pay for medications will be in advance as this is different depending on your insurance coverage. However, we may be able to find a substitute  medication at lower cost or fill out paperwork to get insurance to cover a needed medication.   If a prior authorization is required to get your medication covered by your insurance company, please allow us 1-2 business days to complete this process.  Drug prices often vary depending on where the prescription is filled and some pharmacies may offer cheaper prices.  The website www.goodrx.com contains coupons for medications through different pharmacies. The prices here do not account for what the cost may be with help from insurance (it may be cheaper with your insurance), but the website can give you the price if you did not use any insurance.  - You can print the associated coupon and take it with your prescription to the pharmacy.  - You may also stop by our office during regular business hours and pick up a GoodRx coupon card.  - If you need your prescription sent electronically to a different pharmacy, notify our office through [facility] MyChart or by phone at [phone] option 4.   Wound Care Instructions  Cleanse wound gently with soap and water once a day then pat dry with clean gauze. Apply a thing coat of Petrolatum (petroleum jelly, "Vaseline") over the wound (unless you have an allergy to this). We recommend that you use a new, sterile tube of Vaseline. Do not pick or remove scabs. Do not remove the yellow or white "healing tissue" from the base of the wound.  Cover the   wound with fresh, clean, nonstick gauze and secure with paper tape. You may use Band-Aids in place of gauze and tape if the would is small enough, but would recommend trimming much of the tape off as there is often too much. Sometimes Band-Aids can irritate the skin.  You should call the office for your biopsy report after 1 week if you have not already been contacted.  If you experience any problems, such as abnormal amounts of bleeding, swelling, significant bruising, significant pain, or evidence of infection,  please call the office immediately.  FOR ADULT SURGERY PATIENTS: If you need something for pain relief you may take 1 extra strength Tylenol (acetaminophen) AND 2 Ibuprofen (200mg each) together every 4 hours as needed for pain. (do not take these if you are allergic to them or if you have a reason you should not take them.) Typically, you may only need pain medication for 1 to 3 days.    

## 2021-06-15 ENCOUNTER — Telehealth: Payer: Self-pay

## 2021-06-15 NOTE — Telephone Encounter (Signed)
-----   Message from Ralene Bathe, MD sent at 06/15/2021 10:45 AM EDT ----- Diagnosis Skin , right proximal pretibial SQUAMOUS CELL CARCINOMA, KERATOACANTHOMA TYPE  Cancer - SCC Already treated Recheck next visit

## 2021-06-15 NOTE — Telephone Encounter (Signed)
Discussed biopsy results with pt  °

## 2021-06-24 ENCOUNTER — Encounter: Payer: Self-pay | Admitting: Dermatology

## 2021-07-08 ENCOUNTER — Encounter: Payer: Self-pay | Admitting: *Deleted

## 2021-07-09 ENCOUNTER — Ambulatory Visit: Payer: Medicare Other | Admitting: Anesthesiology

## 2021-07-09 ENCOUNTER — Encounter: Payer: Self-pay | Admitting: *Deleted

## 2021-07-09 ENCOUNTER — Other Ambulatory Visit: Payer: Self-pay

## 2021-07-09 ENCOUNTER — Ambulatory Visit
Admission: RE | Admit: 2021-07-09 | Discharge: 2021-07-09 | Disposition: A | Discharge status: Home / Self Care | Payer: Medicare Other | Attending: Gastroenterology | Admitting: Gastroenterology

## 2021-07-09 ENCOUNTER — Encounter
Admission: RE | Disposition: A | Discharge status: Home / Self Care | Payer: Self-pay | Source: Home / Self Care | Attending: Gastroenterology

## 2021-07-09 DIAGNOSIS — K529 Noninfective gastroenteritis and colitis, unspecified: Secondary | ICD-10-CM | POA: Insufficient documentation

## 2021-07-09 DIAGNOSIS — K5080 Crohn's disease of both small and large intestine without complications: Secondary | ICD-10-CM | POA: Diagnosis present

## 2021-07-09 DIAGNOSIS — Z85828 Personal history of other malignant neoplasm of skin: Secondary | ICD-10-CM | POA: Insufficient documentation

## 2021-07-09 DIAGNOSIS — Z8541 Personal history of malignant neoplasm of cervix uteri: Secondary | ICD-10-CM | POA: Insufficient documentation

## 2021-07-09 DIAGNOSIS — K633 Ulcer of intestine: Secondary | ICD-10-CM | POA: Insufficient documentation

## 2021-07-09 DIAGNOSIS — Z7951 Long term (current) use of inhaled steroids: Secondary | ICD-10-CM | POA: Insufficient documentation

## 2021-07-09 DIAGNOSIS — Z79899 Other long term (current) drug therapy: Secondary | ICD-10-CM | POA: Insufficient documentation

## 2021-07-09 DIAGNOSIS — K50812 Crohn's disease of both small and large intestine with intestinal obstruction: Secondary | ICD-10-CM | POA: Diagnosis not present

## 2021-07-09 DIAGNOSIS — F17210 Nicotine dependence, cigarettes, uncomplicated: Secondary | ICD-10-CM | POA: Insufficient documentation

## 2021-07-09 DIAGNOSIS — Z886 Allergy status to analgesic agent status: Secondary | ICD-10-CM | POA: Diagnosis not present

## 2021-07-09 DIAGNOSIS — K621 Rectal polyp: Secondary | ICD-10-CM | POA: Diagnosis not present

## 2021-07-09 DIAGNOSIS — K6289 Other specified diseases of anus and rectum: Secondary | ICD-10-CM | POA: Diagnosis not present

## 2021-07-09 HISTORY — PX: COLONOSCOPY WITH PROPOFOL: SHX5780

## 2021-07-09 SURGERY — COLONOSCOPY WITH PROPOFOL
Anesthesia: Monitor Anesthesia Care

## 2021-07-09 MED ORDER — LIDOCAINE HCL (CARDIAC) PF 100 MG/5ML IV SOSY
PREFILLED_SYRINGE | INTRAVENOUS | Status: DC | PRN
  Administered 2021-07-09: 60 mg via INTRAVENOUS

## 2021-07-09 MED ORDER — PROPOFOL 500 MG/50ML IV EMUL
INTRAVENOUS | Status: DC | PRN
  Administered 2021-07-09: 150 ug/kg/min via INTRAVENOUS

## 2021-07-09 MED ORDER — PROPOFOL 500 MG/50ML IV EMUL
INTRAVENOUS | Status: AC
  Filled 2021-07-09: qty 50

## 2021-07-09 MED ORDER — PROPOFOL 10 MG/ML IV BOLUS
INTRAVENOUS | Status: DC | PRN
  Administered 2021-07-09: 50 mg via INTRAVENOUS
  Administered 2021-07-09: 20 mg via INTRAVENOUS

## 2021-07-09 MED ORDER — LIDOCAINE HCL (PF) 2 % IJ SOLN
INTRAMUSCULAR | Status: AC
  Filled 2021-07-09: qty 5

## 2021-07-09 MED ORDER — SODIUM CHLORIDE 0.9 % IV SOLN: INTRAVENOUS | Status: DC

## 2021-07-09 NOTE — H&P (Signed)
Outpatient short stay form Pre-procedure 07/09/2021 8:19 AM Rebecca Miyamoto MD, MPH  Primary Physician: Dr. 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 Has history of ileocectomy and partial right colectomy. Last colonoscopy in 2020 with neo-terminal ileum dilated for stricture. Patient with no diarrhea, abdominal pain. No NSAIDS but smokes cigarettes. No family history of GI malignancies. No blood thinners.    Current Facility-Administered Medications:    0.9 %  sodium chloride infusion, , Intravenous, Continuous, Reginold Beale, Hilton Cork, MD, Last Rate: 20 mL/hr at 07/09/21 0736, New Bag at 07/09/21 0736  Medications Prior to Admission  Medication Sig Dispense Refill Last Dose   fluticasone-salmeterol (ADVAIR) 250-50 MCG/ACT AEPB Inhale 1 puff into the lungs in the morning and at bedtime.   07/08/2021   omeprazole (PRILOSEC) 20 MG capsule Take 20 mg by mouth daily.   07/08/2021   albuterol (PROVENTIL HFA;VENTOLIN HFA) 108 (90 Base) MCG/ACT inhaler Inhale 2 puffs into the lungs every 6 (six) hours as needed for wheezing or shortness of breath. 1 Inhaler 0    Certolizumab Pegol 2 X 200 MG/ML KIT Inject 400 mg into the skin every 30 (thirty) days.      CIMZIA PREFILLED 2 X 200 MG/ML PSKT Inject into the skin.      cyanocobalamin (,VITAMIN B-12,) 1000 MCG/ML injection Inject into the muscle.      FLOVENT HFA 110 MCG/ACT inhaler Inhale 2 puffs into the lungs 2 (two) times daily.      Multiple Vitamin (MULTI-VITAMINS) TABS Take by mouth.      traMADol (ULTRAM) 50 MG tablet Take by mouth.      valACYclovir (VALTREX) 1000 MG tablet Take 1,000 mg by mouth daily.      Vitamin D, Ergocalciferol, (DRISDOL) 1.25 MG (50000 UT) CAPS capsule Take by mouth.        Allergies  Allergen Reactions   Nsaids     Due to Crohns disease     Past Medical History:  Diagnosis Date   Anemia    Anxiety    Cervical cancer ([place])     COPD (chronic obstructive pulmonary disease) ([place])    Pt states she has "some" copd   Crohn's disease ([place])    GERD (gastroesophageal reflux disease)    Squamous cell carcinoma of skin 06/10/2021   right proximal pretibial, treated with EDC   Tuberculosis    treated    Review of systems:  Otherwise negative.    Physical Exam  Gen: Alert, oriented. Appears stated age.  HEENT: PERRLA. Lungs: No respiratory distress CV: RRR Abd: soft, benign, no masses Ext: No edema    Planned procedures: Proceed with colonoscopy. The patient understands the nature of the planned procedure, indications, risks, alternatives and potential complications including but not limited to bleeding, infection, perforation, damage to internal organs and possible oversedation/side effects from anesthesia. The patient agrees and gives consent to proceed.  Please refer to procedure notes for findings, recommendations and patient disposition/instructions.     Rebecca Miyamoto MD, MPH Gastroenterology 07/09/2021  8:19 AM

## 2021-07-09 NOTE — Op Note (Signed)
[facility] Gastroenterology Patient Name: Rebecca Escobar Procedure Date: 07/09/2021 8:09 AM MRN: [id number] Account #: [id number] Date of Birth: [birth year] Admit Type: Outpatient Age: 60 Room: [facility] ENDO ROOM 3 Gender: Female Note Status: Finalized Procedure:             Colonoscopy Indications:           Crohn's disease of the small bowel and colon Providers:             Andrey Farmer MD, MD Medicines:             Monitored Anesthesia Care Complications:         No immediate complications. Estimated blood loss:                         Minimal. Procedure:             Pre-Anesthesia Assessment:                        - Prior to the procedure, a History and Physical was                         performed, and patient medications and allergies were                         reviewed. The patient is competent. The risks and                         benefits of the procedure and the sedation options and                         risks were discussed with the patient. All questions                         were answered and informed consent was obtained.                         Patient identification and proposed procedure were                         verified by the physician, the nurse, the anesthetist                         and the technician in the endoscopy suite. Mental                         Status Examination: alert and oriented. Airway                         Examination: normal oropharyngeal airway and neck                         mobility. Respiratory Examination: clear to                         auscultation. CV Examination: normal. Prophylactic                         Antibiotics: The patient does not require prophylactic  antibiotics. Prior Anticoagulants: The patient has                         taken no previous anticoagulant or antiplatelet                         agents. ASA Grade Assessment: III - A patient with                          severe systemic disease. After reviewing the risks and                         benefits, the patient was deemed in satisfactory                         condition to undergo the procedure. The anesthesia                         plan was to use monitored anesthesia care (MAC).                         Immediately prior to administration of medications,                         the patient was re-assessed for adequacy to receive                         sedatives. The heart rate, respiratory rate, oxygen                         saturations, blood pressure, adequacy of pulmonary                         ventilation, and response to care were monitored                         throughout the procedure. The physical status of the                         patient was re-assessed after the procedure.                        After obtaining informed consent, the colonoscope was                         passed under direct vision. Throughout the procedure,                         the patient's blood pressure, pulse, and oxygen                         saturations were monitored continuously. The                         Colonoscope was introduced through the anus and                         advanced to the the ileocolonic anastomosis. The  colonoscopy was somewhat difficult due to significant                         looping. Successful completion of the procedure was                         aided by applying abdominal pressure. The patient                         tolerated the procedure well. The quality of the bowel                         preparation was inadequate. Findings:      The perianal and digital rectal examinations were normal.      The neo-terminal ileum contained a benign-appearing, intrinsic moderate       stenosis that was non-traversed mainly due to severe looping. Biopsies       were taken with a cold forceps for histology. Estimated blood loss was       minimal.       Multiple sessile, non-bleeding polyps were found in the rectum. The       polyps were small in size. Biopsies were taken with a cold forceps for       histology as they appeared benign in nature.      Normal mucosa was found in the entire colon. Biopsies were taken with a       cold forceps for histology in the rectum given previous history of       proctitis.      The exam was otherwise without abnormality on direct and retroflexion       views. Impression:            - Preparation of the colon was inadequate.                        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 Biopsied.                        - Multiple small, non-bleeding polyps in the rectum.                         Biopsied.                        - Normal mucosa in the entire examined colon. Biopsied.                        - The examination was otherwise normal on direct and                         retroflexion views. Recommendation:        - Discharge patient to home.                        - Resume previous diet.                        - Continue present medications.                        - Await pathology results. Prep was  inadequate so will                         not count as a screening for adenomatous polyps but                         had a good prep on her colon in 2020 with no                         adenomatous polyps. Next colon can be done when she                         gets her regular disease activity assessment.                        - Perform a computed tomographic (CT scan)                         enterography at appointment to be scheduled. Procedure Code(s):     --- Professional ---                        [phone], Colonoscopy, flexible; with biopsy, single or                         multiple Diagnosis Code(s):     --- Professional ---                        U98.119, Crohn's disease of both small and large                         intestine with intestinal obstruction                         K62.1, Rectal polyp CPT copyright 2019 [employer]. All rights reserved. The codes documented in this report are preliminary and upon coder review may  be revised to meet current compliance requirements. Andrey Farmer MD, MD 07/09/2021 8:59:01 AM Number of Addenda: 0 Note Initiated On: 07/09/2021 8:09 AM Scope Withdrawal Time: 0 hours 9 minutes 42 seconds  Total Procedure Duration: 0 hours 21 minutes 58 seconds  Estimated Blood Loss:  Estimated blood loss was minimal.      [facility]

## 2021-07-09 NOTE — Transfer of Care (Signed)
Immediate Anesthesia Transfer of Care Note  Patient: Rebecca Escobar  Procedure(s) Performed: COLONOSCOPY WITH PROPOFOL  Patient Location: PACU  Anesthesia Type:MAC  Level of Consciousness: drowsy  Airway & Oxygen Therapy: Patient Spontanous Breathing  Post-op Assessment: Report given to RN and Post -op Vital signs reviewed and stable  Post vital signs: Reviewed and stable  Last Vitals:  Vitals Value Taken Time  BP 122/70 07/09/21 0855  Temp 36.3 C 07/09/21 0853  Pulse 77 07/09/21 0855  Resp 22 07/09/21 0855  SpO2 96 % 07/09/21 0855    Last Pain:  Vitals:   07/09/21 0853  TempSrc: Temporal  PainSc: Asleep         Complications: No notable events documented.

## 2021-07-09 NOTE — Anesthesia Preprocedure Evaluation (Signed)
Anesthesia Evaluation  Patient identified by MRN, date of birth, ID band Patient awake    Reviewed: Allergy & Precautions, NPO status , Patient's Chart, lab work & pertinent test results  History of Anesthesia Complications Negative for: history of anesthetic complications  Airway Mallampati: II  TM Distance: >3 FB     Dental  (+) Poor Dentition, Missing   Pulmonary COPD (MDI 1x per week),  COPD inhaler, Current Smoker and Patient abstained from smoking.,    Pulmonary exam normal breath sounds clear to auscultation       Cardiovascular Exercise Tolerance: Good METS: 3 - Mets negative cardio ROS   Rhythm:Regular Rate:Normal     Neuro/Psych Anxiety negative neurological ROS  negative psych ROS   GI/Hepatic Neg liver ROS, GERD  ,  Endo/Other  negative endocrine ROS  Renal/GU negative Renal ROS  negative genitourinary   Musculoskeletal negative musculoskeletal ROS (+)   Abdominal   Peds  Hematology negative hematology ROS (+) anemia ,   Anesthesia Other Findings   Reproductive/Obstetrics negative OB ROS                             Anesthesia Physical Anesthesia Plan  ASA: 2  Anesthesia Plan: MAC   Post-op Pain Management:    Induction: Intravenous  PONV Risk Score and Plan: Ondansetron  Airway Management Planned: Oral ETT  Additional Equipment:   Intra-op Plan:   Post-operative Plan: Extubation in OR  Informed Consent: I have reviewed the patients History and Physical, chart, labs and discussed the procedure including the risks, benefits and alternatives for the proposed anesthesia with the patient or authorized representative who has indicated his/her understanding and acceptance.     Dental advisory given  Plan Discussed with: CRNA  Anesthesia Plan Comments: (Benefits and risk discussed with patient to include death, MI and CVA.  Pt wishes to proceed.)         Anesthesia Quick Evaluation

## 2021-07-09 NOTE — Interval H&P Note (Signed)
History and Physical Interval Note:  07/09/2021 8:22 AM  Rebecca Escobar  has presented today for surgery, with the diagnosis of CROHN'S DISEASE INVOLVING TERMINAL ILEUM.  The various methods of treatment have been discussed with the patient and family. After consideration of risks, benefits and other options for treatment, the patient has consented to  Procedure(s): COLONOSCOPY WITH PROPOFOL (N/A) as a surgical intervention.  The patient's history has been reviewed, patient examined, no change in status, stable for surgery.  I have reviewed the patient's chart and labs.  Questions were answered to the patient's satisfaction.     Lesly Rubenstein  Ok to proceed with colonoscopy

## 2021-07-10 NOTE — Anesthesia Postprocedure Evaluation (Signed)
Anesthesia Post Note  Patient: Rebecca Escobar  Procedure(s) Performed: COLONOSCOPY WITH PROPOFOL  Patient location during evaluation: PACU Anesthesia Type: MAC Level of consciousness: awake and alert Pain management: pain level controlled Vital Signs Assessment: post-procedure vital signs reviewed and stable Respiratory status: spontaneous breathing, nonlabored ventilation, respiratory function stable and patient connected to nasal cannula oxygen Cardiovascular status: stable and blood pressure returned to baseline Postop Assessment: no apparent nausea or vomiting Anesthetic complications: no   No notable events documented.   Last Vitals:  Vitals:   07/09/21 0903 07/09/21 0913  BP: 137/87 (!) 153/80  Pulse: 69 66  Resp: 19 19  Temp:    SpO2: 98% 98%    Last Pain:  Vitals:   07/10/21 0840  TempSrc:   PainSc: 0-No pain                 Tonny Bollman

## 2021-07-12 ENCOUNTER — Encounter: Payer: Self-pay | Admitting: Gastroenterology

## 2021-07-12 LAB — SURGICAL PATHOLOGY

## 2021-07-15 ENCOUNTER — Other Ambulatory Visit: Payer: Self-pay | Admitting: Gastroenterology

## 2021-07-15 ENCOUNTER — Other Ambulatory Visit (HOSPITAL_COMMUNITY): Payer: Self-pay | Admitting: Gastroenterology

## 2021-07-15 DIAGNOSIS — K5000 Crohn's disease of small intestine without complications: Secondary | ICD-10-CM

## 2021-07-28 ENCOUNTER — Other Ambulatory Visit: Payer: Self-pay

## 2021-07-28 ENCOUNTER — Ambulatory Visit
Admission: RE | Admit: 2021-07-28 | Discharge: 2021-07-28 | Disposition: A | Discharge status: Home / Self Care | Payer: Medicare Other | Source: Ambulatory Visit | Attending: Gastroenterology | Admitting: Gastroenterology

## 2021-07-28 DIAGNOSIS — K5000 Crohn's disease of small intestine without complications: Secondary | ICD-10-CM | POA: Insufficient documentation

## 2021-07-28 LAB — POCT I-STAT CREATININE: Creatinine, Ser: 0.6 mg/dL (ref 0.44–1.00)

## 2021-07-28 MED ORDER — IOHEXOL 350 MG/ML SOLN
80.0000 mL | Freq: Once | INTRAVENOUS | Status: AC | PRN
  Administered 2021-07-28: 80 mL via INTRAVENOUS

## 2021-08-25 ENCOUNTER — Inpatient Hospital Stay: Payer: Medicare Other | Attending: Oncology

## 2021-08-27 ENCOUNTER — Inpatient Hospital Stay: Payer: Medicare Other | Admitting: Oncology

## 2021-09-09 IMAGING — CT CT ENTEROGRAPHY (ABD-PELV W/ CM)
2 of 6 series · 16 of 46 positions shown, 18 images · IV contrast (APPLIED)
Comparison: 09/11/2019

CLINICAL DATA: Crohn disease.

EXAM:
CT ABDOMEN AND PELVIS WITH CONTRAST (ENTEROGRAPHY)
TECHNIQUE: Multidetector CT of the abdomen and pelvis during bolus
administration of intravenous contrast. Negative oral contrast was
given.
CONTRAST:  80mL OMNIPAQUE IOHEXOL 350 MG/ML SOLN

[Series 3: entero thins · axial · 0.71mm/px · z∈[-526,-108]mm · 13 of 235 slices shown, 15 images]
[im 13/235  soft-tissue]
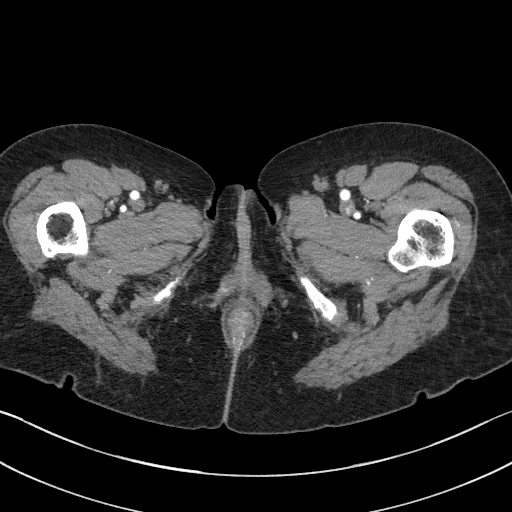
[im 13/235  bone]
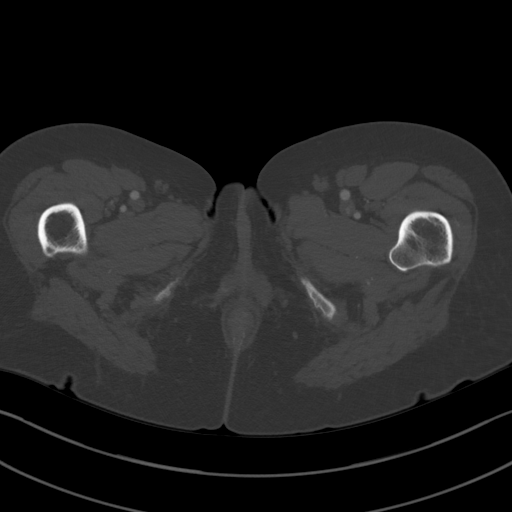
[im 37/235  soft-tissue]
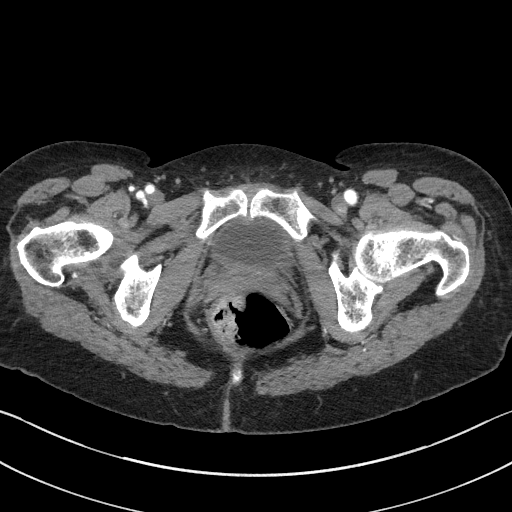
[im 50/235  soft-tissue]
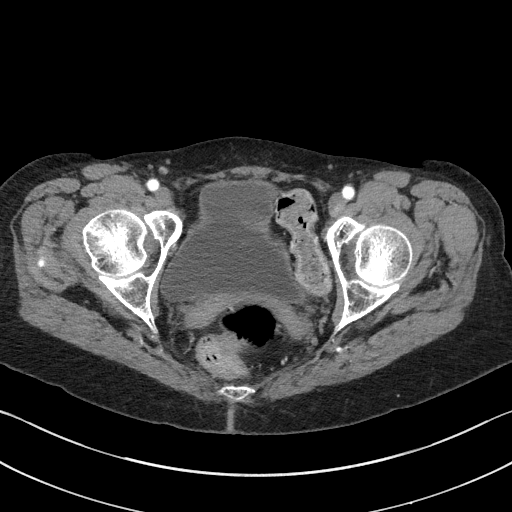
[im 62/235  soft-tissue]
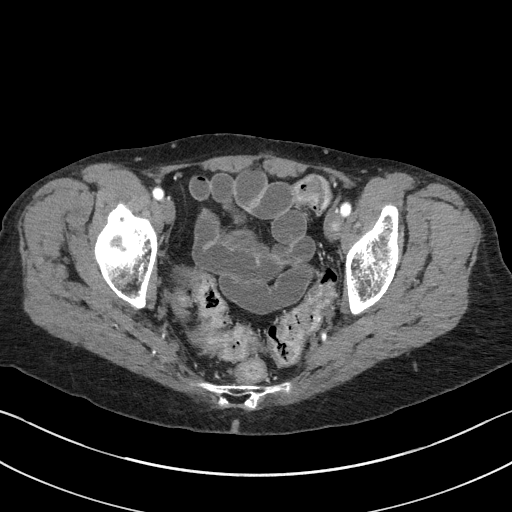
[im 87/235  soft-tissue]
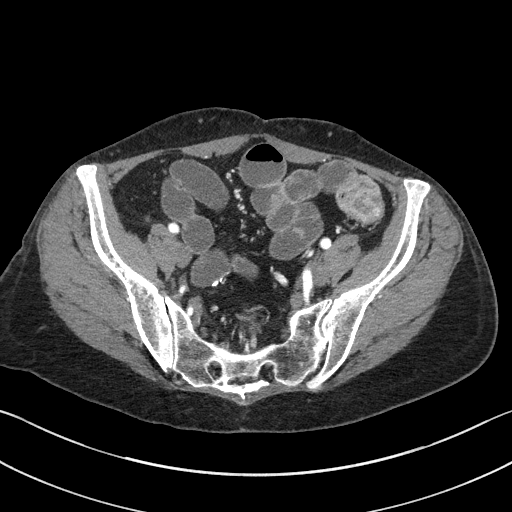
[im 99/235  soft-tissue]
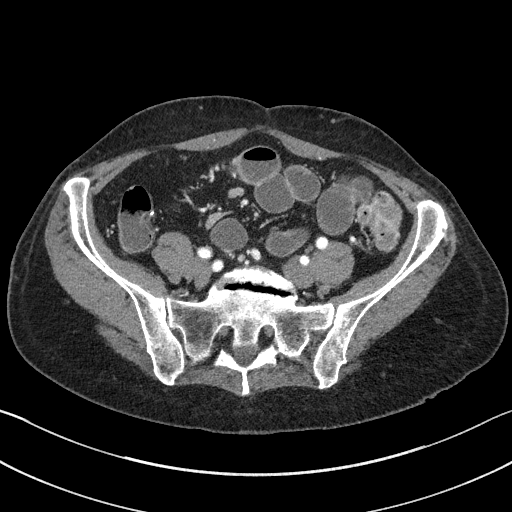
[im 124/235  soft-tissue]
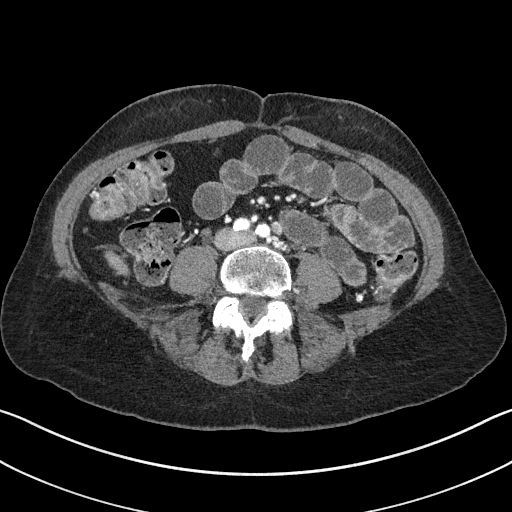
[im 136/235  soft-tissue]
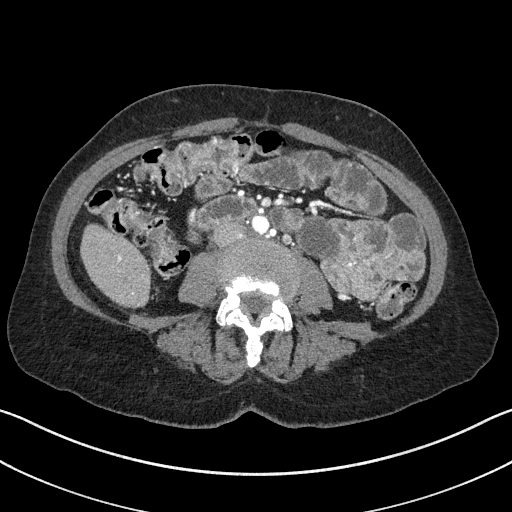
[im 148/235  soft-tissue]
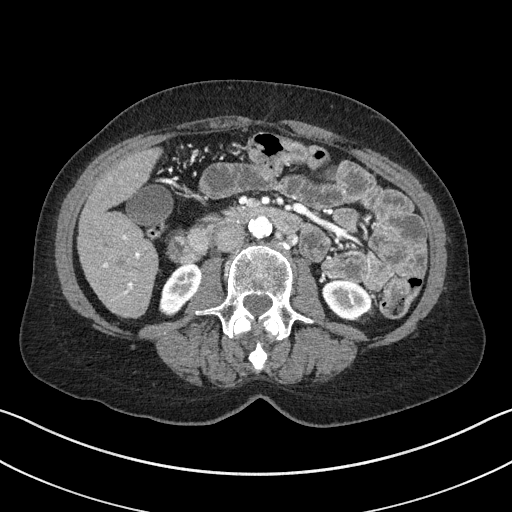
[im 148/235  bone]
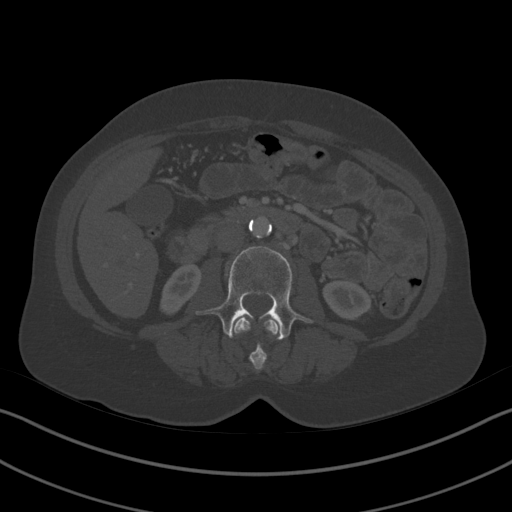
[im 173/235  soft-tissue]
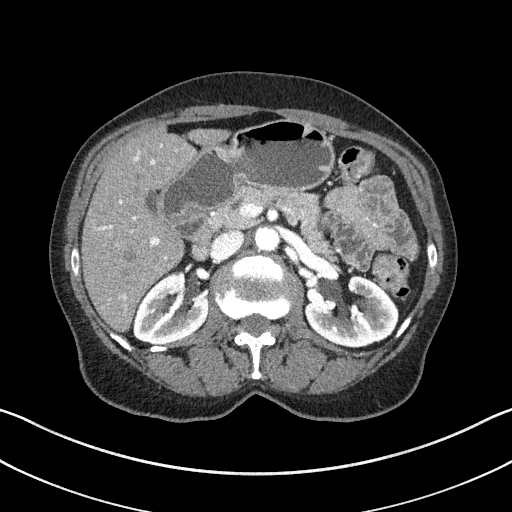
[im 185/235  soft-tissue]
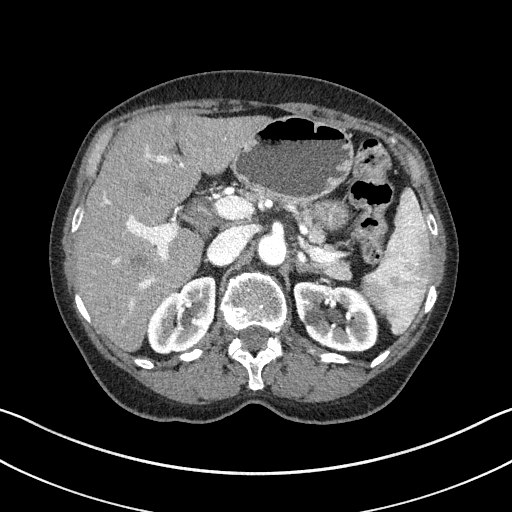
[im 198/235  soft-tissue]
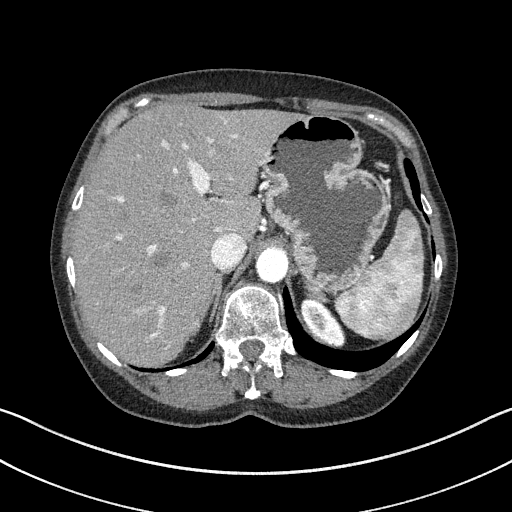
[im 222/235  soft-tissue]
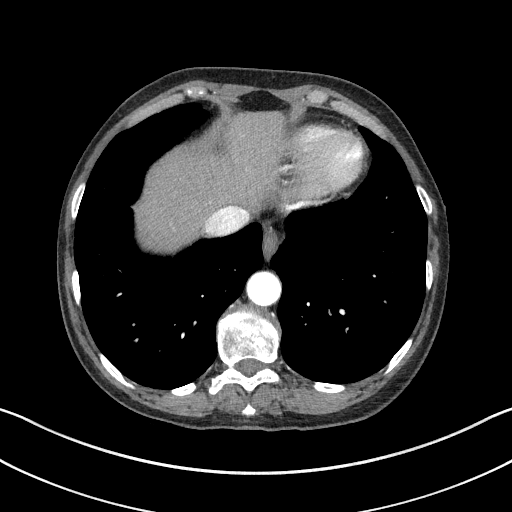

[Series 6: coronal · coronal · 0.72mm/px · 3 of 88 slices shown]
[im 30/88  soft-tissue]
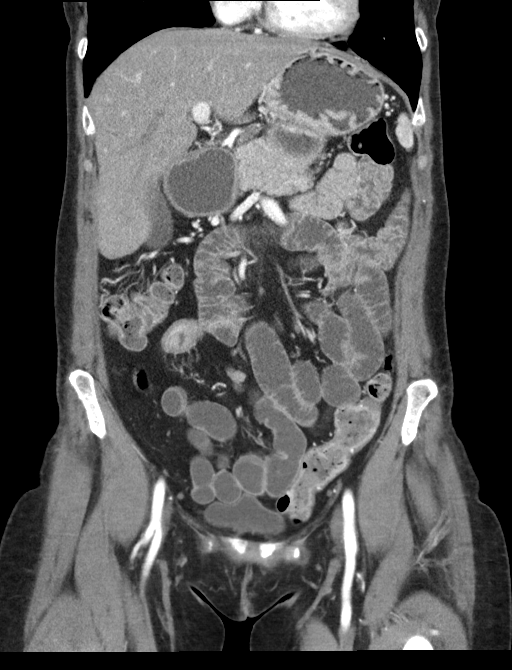
[im 39/88  soft-tissue]
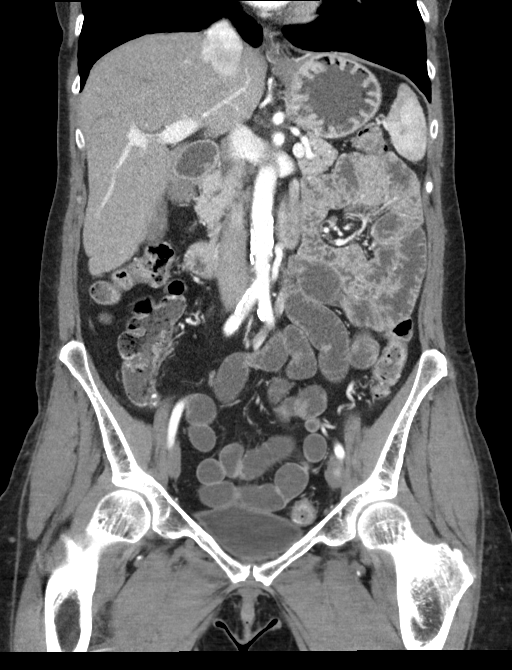
[im 49/88  soft-tissue]
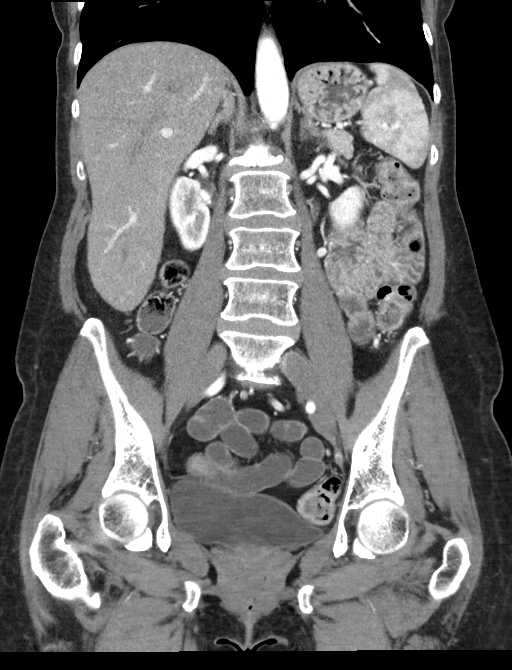

[16 of 46 positions shown; findings below may reference images not displayed]

FINDINGS: Lower Chest: No acute findings.

Hepatobiliary: No hepatic masses identified. Stable tiny vascular
shunt no posterior dome of the right hepatic lobe. Gallbladder is
unremarkable. No evidence of biliary ductal dilatation.

Pancreas:  No mass or inflammatory changes.

Spleen: Within normal limits in size and appearance.

Adrenals/Urinary Tract: No masses identified. Stable tiny left renal
cyst. No evidence of ureteral calculi or hydronephrosis.

Stomach/Bowel: Postop changes again seen from previous distal small
bowel resection. Increased wall thickening and mucosal enhancement
is seen involving the distal ileum over a length of approximately 7
cm to the ileocecal anastomosis. Stricture is also seen involving
the distal ileum over a length of approximately 3 cm. Mild small
bowel dilatation and air-fluid levels are seen proximally,
consistent with a partial small bowel obstruction. No signs of
penetrating disease, fistulae, or abnormal fluid collections.

Vascular/Lymphatic: No pathologically enlarged lymph nodes. No acute
vascular findings. Aortic atherosclerotic calcification noted.

Reproductive: Prior hysterectomy noted. Adnexal regions are
unremarkable in appearance.

Other:  None.

Musculoskeletal:  No suspicious bone lesions identified.
IMPRESSION: Mild progression of active inflammatory small bowel Crohn disease
involving the distal ileum to the site of the ileocecal anastomosis.

Stricture involving the distal ileum, with partial small bowel
obstruction.

No evidence of penetrating disease or abscess.

Aortic Atherosclerosis (S97HB-[id number].V).

## 2021-09-17 ENCOUNTER — Inpatient Hospital Stay: Payer: Medicare Other | Attending: Oncology | Admitting: Oncology

## 2021-09-28 ENCOUNTER — Other Ambulatory Visit: Payer: Self-pay

## 2021-09-28 DIAGNOSIS — D539 Nutritional anemia, unspecified: Secondary | ICD-10-CM

## 2021-09-28 DIAGNOSIS — Z87891 Personal history of nicotine dependence: Secondary | ICD-10-CM

## 2021-09-29 ENCOUNTER — Inpatient Hospital Stay: Payer: Medicare Other

## 2021-09-29 ENCOUNTER — Other Ambulatory Visit: Payer: Self-pay

## 2021-09-29 ENCOUNTER — Inpatient Hospital Stay: Payer: Medicare Other | Attending: Oncology

## 2021-09-29 DIAGNOSIS — D7589 Other specified diseases of blood and blood-forming organs: Secondary | ICD-10-CM | POA: Insufficient documentation

## 2021-09-29 DIAGNOSIS — D539 Nutritional anemia, unspecified: Secondary | ICD-10-CM

## 2021-09-29 DIAGNOSIS — R197 Diarrhea, unspecified: Secondary | ICD-10-CM | POA: Insufficient documentation

## 2021-09-29 DIAGNOSIS — F17210 Nicotine dependence, cigarettes, uncomplicated: Secondary | ICD-10-CM | POA: Diagnosis not present

## 2021-09-29 LAB — COMPREHENSIVE METABOLIC PANEL
ALT: 8 U/L (ref 0–44)
AST: 14 U/L — ABNORMAL LOW (ref 15–41)
Albumin: 3.7 g/dL (ref 3.5–5.0)
Alkaline Phosphatase: 91 U/L (ref 38–126)
Anion gap: 9 (ref 5–15)
BUN: 14 mg/dL (ref 8–23)
CO2: 27 mmol/L (ref 22–32)
Calcium: 8.4 mg/dL — ABNORMAL LOW (ref 8.9–10.3)
Chloride: 104 mmol/L (ref 98–111)
Creatinine, Ser: 0.64 mg/dL (ref 0.44–1.00)
GFR, Estimated: >60 mL/min (ref >60)
Glucose, Bld: 91 mg/dL (ref 70–99)
Potassium: 3.8 mmol/L (ref 3.5–5.1)
Sodium: 140 mmol/L (ref 135–145)
Total Bilirubin: 0.2 mg/dL — ABNORMAL LOW (ref 0.3–1.2)
Total Protein: 6.8 g/dL (ref 6.5–8.1)

## 2021-09-29 LAB — FOLATE: Folate: 11.2 ng/mL (ref >5.9)

## 2021-09-29 LAB — CBC WITH DIFFERENTIAL/PLATELET
Abs Immature Granulocytes: 0.01 10*3/uL (ref 0.00–0.07)
Basophils Absolute: 0.1 10*3/uL (ref 0.0–0.1)
Basophils Relative: 1 %
Eosinophils Absolute: 0.2 10*3/uL (ref 0.0–0.5)
Eosinophils Relative: 3 %
HCT: 40 % (ref 36.0–46.0)
Hemoglobin: 13.3 g/dL (ref 12.0–15.0)
Immature Granulocytes: 0 %
Lymphocytes Relative: 47 %
Lymphs Abs: 3.1 10*3/uL (ref 0.7–4.0)
MCH: 34.6 pg — ABNORMAL HIGH (ref 26.0–34.0)
MCHC: 33.3 g/dL (ref 30.0–36.0)
MCV: 104.2 fL — ABNORMAL HIGH (ref 80.0–100.0)
Monocytes Absolute: 0.4 10*3/uL (ref 0.1–1.0)
Monocytes Relative: 7 %
Neutro Abs: 2.8 10*3/uL (ref 1.7–7.7)
Neutrophils Relative %: 42 %
Platelets: 187 10*3/uL (ref 150–400)
RBC: 3.84 MIL/uL — ABNORMAL LOW (ref 3.87–5.11)
RDW: 12.7 % (ref 11.5–15.5)
WBC: 6.6 10*3/uL (ref 4.0–10.5)
nRBC: 0 % (ref 0.0–0.2)

## 2021-09-29 LAB — VITAMIN B12: Vitamin B-12: 427 pg/mL (ref 180–914)

## 2021-10-01 ENCOUNTER — Inpatient Hospital Stay: Payer: Medicare Other | Admitting: Oncology

## 2021-10-05 ENCOUNTER — Encounter: Payer: Self-pay | Admitting: Oncology

## 2021-10-05 ENCOUNTER — Inpatient Hospital Stay (HOSPITAL_BASED_OUTPATIENT_CLINIC_OR_DEPARTMENT_OTHER): Payer: Medicare Other | Admitting: Oncology

## 2021-10-05 ENCOUNTER — Other Ambulatory Visit: Payer: Self-pay

## 2021-10-05 VITALS — BP 123/79 | HR 73 | Temp 97.8°F | Wt 160.2 lb

## 2021-10-05 DIAGNOSIS — F17200 Nicotine dependence, unspecified, uncomplicated: Secondary | ICD-10-CM

## 2021-10-05 DIAGNOSIS — D7589 Other specified diseases of blood and blood-forming organs: Secondary | ICD-10-CM

## 2021-10-05 NOTE — Progress Notes (Signed)
Hematology/Oncology Follow up note [facility] [phone] [phone]   Patient Care Team: Ellamae Sia, MD (Inactive) as PCP - General (Internal Medicine)  REFERRING PROVIDER: [facility] Reason for Visit:  Follow-up for microcytosis  HISTORY OF PRESENTING ILLNESS:  Rebecca Escobar is a  60 y.o.  female with PMH listed below who was referred to me for evaluation of macrocytosis.  Patient follows up with [facility] GI group and has had labs done recently.  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  Reviewed patient's lab results in [place].  MCV has been above 100 since October 2016.  WBC count hemoglobin and platelet levels have been normal. She has Crohn's disease and has been on certolizumab for the past 10 years.  No new medications or herbal supplementations.  No easy bruising or bleeding events.  CT lung scan done in May 2020. # chronic Crohn's disease has been at baseline.  Intermittent diarrhea.  INTERVAL HISTORY Rebecca Escobar is a 61 y.o. female who has above history reviewed by me today presents for follow up visit for macrocytosis.  She has progression of her Crohn's disease symptoms and was recommended to have surgery. She is trying to postpone the surgery to early next year.  No fever, chills, night sweat.   Review of Systems  Constitutional:  Negative for chills, fever, malaise/fatigue and weight loss.  HENT:  Negative for sore throat.   Eyes:  Negative for redness.  Respiratory:  Negative for cough, shortness of breath and wheezing.   Cardiovascular:  Negative for chest pain, palpitations and leg swelling.  Gastrointestinal:  Negative for abdominal pain, blood in stool, nausea and vomiting.  Genitourinary:  Negative for dysuria.  Musculoskeletal:  Negative for myalgias.  Skin:  Negative for rash.  Neurological:  Negative for dizziness, tingling and  tremors.  Endo/Heme/Allergies:  Does not bruise/bleed easily.  Psychiatric/Behavioral:  Negative for hallucinations.    MEDICAL HISTORY:  Past Medical History:  Diagnosis Date   Anemia    Anxiety    Cervical cancer ([place])    COPD (chronic obstructive pulmonary disease) ([place])    Pt states she has "some" copd   Crohn's disease ([place])    GERD (gastroesophageal reflux disease)    Squamous cell carcinoma of skin 06/10/2021   right proximal pretibial, treated with EDC   Tuberculosis    treated    SURGICAL HISTORY: Past Surgical History:  Procedure Laterality Date   ABDOMINAL HYSTERECTOMY     CESAREAN SECTION     COLON SURGERY     COLONOSCOPY WITH PROPOFOL N/A 03/02/2016   Procedure: COLONOSCOPY WITH PROPOFOL;  Surgeon: Manya Silvas, MD;  Location: [place];  Service: Endoscopy;  Laterality: N/A;   COLONOSCOPY WITH PROPOFOL N/A 11/20/2019   Procedure: COLONOSCOPY WITH PROPOFOL;  Surgeon: Toledo, Benay Pike, MD;  Location: [facility] ENDOSCOPY;  Service: Gastroenterology;  Laterality: N/A;   COLONOSCOPY WITH PROPOFOL N/A 07/09/2021   Procedure: COLONOSCOPY WITH PROPOFOL;  Surgeon: Lesly Rubenstein, MD;  Location: [facility] ENDOSCOPY;  Service: Endoscopy;  Laterality: N/A;   DILATION AND CURETTAGE OF UTERUS     ESOPHAGOGASTRODUODENOSCOPY (EGD) WITH PROPOFOL N/A 11/20/2019   Procedure: ESOPHAGOGASTRODUODENOSCOPY (EGD) WITH PROPOFOL;  Surgeon: Toledo, Benay Pike, MD;  Location: [facility] ENDOSCOPY;  Service: Gastroenterology;  Laterality: N/A;    SOCIAL HISTORY: Social History   Socioeconomic History   Marital status: Single    Spouse name: Not on file   Number  of children: Not on file   Years of education: Not on file   Highest education level: Not on file  Occupational History   Not on file  Tobacco Use   Smoking status: Every Day    Packs/day: 0.75    Years: 40.00    Pack years: 30.00    Types: Cigarettes   Smokeless tobacco: Never  Vaping Use   Vaping Use: Never used   Substance and Sexual Activity   Alcohol use: No   Drug use: No   Sexual activity: Yes  Other Topics Concern   Not on file  Social History Narrative   ** Merged History Encounter **       Social Determinants of Health   Financial Resource Strain: Not on file  Food Insecurity: Not on file  Transportation Needs: Not on file  Physical Activity: Not on file  Stress: Not on file  Social Connections: Not on file  Intimate Partner Violence: Not on file    FAMILY HISTORY: Family History  Problem Relation Age of Onset   Kidney disease Neg Hx    Bladder Cancer Neg Hx    Breast cancer Neg Hx     ALLERGIES:  is allergic to nsaids.  MEDICATIONS:  Current Outpatient Medications  Medication Sig Dispense Refill   albuterol (PROVENTIL HFA;VENTOLIN HFA) 108 (90 Base) MCG/ACT inhaler Inhale 2 puffs into the lungs every 6 (six) hours as needed for wheezing or shortness of breath. 1 Inhaler 0   Certolizumab Pegol 2 X 200 MG/ML KIT Inject 400 mg into the skin every 30 (thirty) days.     CIMZIA PREFILLED 2 X 200 MG/ML PSKT Inject into the skin.     cyanocobalamin (,VITAMIN B-12,) 1000 MCG/ML injection Inject into the muscle.     FLOVENT HFA 110 MCG/ACT inhaler Inhale 2 puffs into the lungs 2 (two) times daily.     fluticasone-salmeterol (ADVAIR) 250-50 MCG/ACT AEPB Inhale 1 puff into the lungs in the morning and at bedtime.     Multiple Vitamin (MULTI-VITAMINS) TABS Take by mouth.     omeprazole (PRILOSEC) 20 MG capsule Take 20 mg by mouth daily.     traMADol (ULTRAM) 50 MG tablet Take by mouth.     valACYclovir (VALTREX) 1000 MG tablet Take 1,000 mg by mouth daily.     Vitamin D, Ergocalciferol, (DRISDOL) 1.25 MG (50000 UT) CAPS capsule Take by mouth.     No current facility-administered medications for this visit.     PHYSICAL EXAMINATION: ECOG PERFORMANCE STATUS: 1 - Symptomatic but completely ambulatory Vitals:   10/05/21 1008  BP: 123/79  Pulse: 73  Temp: 97.8 F (36.6 C)   SpO2: 97%   Filed Weights   10/05/21 1008  Weight: 160 lb 3.2 oz (72.7 kg)    Physical Exam Constitutional:      General: She is not in acute distress.    Appearance: She is not diaphoretic.     Comments: Thin  HENT:     Head: Normocephalic and atraumatic.     Nose: Nose normal.     Mouth/Throat:     Pharynx: No oropharyngeal exudate.  Eyes:     General: No scleral icterus.       Left eye: No discharge.     Conjunctiva/sclera: Conjunctivae normal.     Pupils: Pupils are equal, round, and reactive to light.  Cardiovascular:     Rate and Rhythm: Normal rate and regular rhythm.     Heart sounds: Normal heart sounds.  No murmur heard. Pulmonary:     Effort: Pulmonary effort is normal. No respiratory distress.     Breath sounds: No wheezing or rales.  Chest:     Chest wall: No tenderness.  Abdominal:     General: Bowel sounds are normal. There is no distension.     Palpations: Abdomen is soft. There is no mass.     Tenderness: There is no abdominal tenderness.  Musculoskeletal:        General: Normal range of motion.     Cervical back: Normal range of motion and neck supple.  Lymphadenopathy:     Cervical: No cervical adenopathy.  Skin:    General: Skin is warm and dry.     Findings: No erythema.  Neurological:     Mental Status: She is alert and oriented to person, place, and time.     Cranial Nerves: No cranial nerve deficit.     Motor: No abnormal muscle tone.     Coordination: Coordination normal.  Psychiatric:        Mood and Affect: Mood and affect normal.     LABORATORY DATA:  I have reviewed the data as listed Lab Results  Component Value Date   WBC 6.6 09/29/2021   HGB 13.3 09/29/2021   HCT 40.0 09/29/2021   MCV 104.2 (H) 09/29/2021   PLT 187 09/29/2021   Recent Labs    04/08/21 1959 07/28/21 1129 09/29/21 1300  NA 139  --  140  K 3.9  --  3.8  CL 105  --  104  CO2 27  --  27  GLUCOSE 81  --  91  BUN 16  --  14  CREATININE 0.83 0.60 0.64   CALCIUM 8.7*  --  8.4*  GFRNONAA >60  --  >60  PROT  --   --  6.8  ALBUMIN  --   --  3.7  AST  --   --  14*  ALT  --   --  8  ALKPHOS  --   --  91  BILITOT  --   --  0.2*     Labs from [facility].  TSH 1.651 Ferritin 95 MMA 152 11/01/2017 B12 546 01/08/2018 Homocysteine 20.5 01/17/2018 Folate 5.9   normal copper,  B12 level, , negative flowcytometry, normal LDH, no immature and morphological abnormality on smear review,  ASSESSMENT & PLAN:  1. Macrocytosis without anemia   2. Current every day smoker   3. Hypocalcemia    #Macrocytosis Labs are reviewed and discussed with patient. She has stable normal wbc, hemoglobin and platelet.  Chronically elevated MCV, trending up, normal Vitamin B12 and folate levels.  Continue empiric Vitamin B12 supplementation.   Discussed with patient that chronic macrocytosis may be secondary to her medications.recommend her to stop PPI.  I cannot rule out possibility of early process of MDS. Given that her counts are stable I will hold off for biopsy and continue watchful waiting. She agrees with the plan  Tobacco use, patient has been started on lung cancer screening. 07/28/2021 CT lung cancer screening Lung RADS 2 Smoke cessation discussed,   Hypocalcemia, recommend her to start calcium supplementation 122m daily.   I will see patient in 6  months. Recommend patient continue follow-up with primary care provider.  All questions were answered. The patient knows to call the clinic with any problems questions or concerns.  ZEarlie Server MD, PhD 10/05/2021

## 2021-10-28 ENCOUNTER — Other Ambulatory Visit: Payer: Self-pay

## 2021-10-28 ENCOUNTER — Inpatient Hospital Stay: Payer: Medicare Other | Attending: Oncology

## 2021-10-28 DIAGNOSIS — D7589 Other specified diseases of blood and blood-forming organs: Secondary | ICD-10-CM | POA: Diagnosis present

## 2021-10-28 LAB — CBC WITH DIFFERENTIAL/PLATELET
Abs Immature Granulocytes: 0.01 10*3/uL (ref 0.00–0.07)
Basophils Absolute: 0.1 10*3/uL (ref 0.0–0.1)
Basophils Relative: 1 %
Eosinophils Absolute: 0.2 10*3/uL (ref 0.0–0.5)
Eosinophils Relative: 2 %
HCT: 41.5 % (ref 36.0–46.0)
Hemoglobin: 14.2 g/dL (ref 12.0–15.0)
Immature Granulocytes: 0 %
Lymphocytes Relative: 43 %
Lymphs Abs: 3.3 10*3/uL (ref 0.7–4.0)
MCH: 34.8 pg — ABNORMAL HIGH (ref 26.0–34.0)
MCHC: 34.2 g/dL (ref 30.0–36.0)
MCV: 101.7 fL — ABNORMAL HIGH (ref 80.0–100.0)
Monocytes Absolute: 0.7 10*3/uL (ref 0.1–1.0)
Monocytes Relative: 10 %
Neutro Abs: 3.3 10*3/uL (ref 1.7–7.7)
Neutrophils Relative %: 44 %
Platelets: 195 10*3/uL (ref 150–400)
RBC: 4.08 MIL/uL (ref 3.87–5.11)
RDW: 12.7 % (ref 11.5–15.5)
Smear Review: NORMAL
WBC: 7.6 10*3/uL (ref 4.0–10.5)
nRBC: 0 % (ref 0.0–0.2)

## 2021-10-31 LAB — COPPER, SERUM: Copper: 109 ug/dL (ref 80–158)

## 2021-11-01 ENCOUNTER — Telehealth: Payer: Self-pay | Admitting: Oncology

## 2021-11-01 LAB — VITAMIN B1: Vitamin B1 (Thiamine): 134.7 nmol/L (ref 66.5–200.0)

## 2021-11-01 NOTE — Telephone Encounter (Signed)
Pt had labs on 11/10, please advise. Returns to clinic in April.

## 2021-11-01 NOTE — Telephone Encounter (Signed)
Pt wants to set up an appt. For the results of her labs. Please call back at [phone]

## 2021-11-02 NOTE — Telephone Encounter (Signed)
Pt informed that labs are stable. No need to schedule appt.

## 2022-01-26 ENCOUNTER — Encounter: Payer: Medicare Other | Admitting: Dermatology

## 2022-04-05 ENCOUNTER — Inpatient Hospital Stay: Payer: Medicare Other | Attending: Oncology

## 2022-04-05 DIAGNOSIS — Z886 Allergy status to analgesic agent status: Secondary | ICD-10-CM | POA: Diagnosis not present

## 2022-04-05 DIAGNOSIS — Z85828 Personal history of other malignant neoplasm of skin: Secondary | ICD-10-CM | POA: Diagnosis not present

## 2022-04-05 DIAGNOSIS — Z8541 Personal history of malignant neoplasm of cervix uteri: Secondary | ICD-10-CM | POA: Insufficient documentation

## 2022-04-05 DIAGNOSIS — F17210 Nicotine dependence, cigarettes, uncomplicated: Secondary | ICD-10-CM | POA: Diagnosis not present

## 2022-04-05 DIAGNOSIS — K5090 Crohn's disease, unspecified, without complications: Secondary | ICD-10-CM | POA: Diagnosis not present

## 2022-04-05 DIAGNOSIS — R197 Diarrhea, unspecified: Secondary | ICD-10-CM | POA: Diagnosis not present

## 2022-04-05 DIAGNOSIS — R718 Other abnormality of red blood cells: Secondary | ICD-10-CM | POA: Diagnosis present

## 2022-04-05 DIAGNOSIS — Z79899 Other long term (current) drug therapy: Secondary | ICD-10-CM | POA: Diagnosis not present

## 2022-04-05 DIAGNOSIS — D7589 Other specified diseases of blood and blood-forming organs: Secondary | ICD-10-CM | POA: Diagnosis not present

## 2022-04-05 LAB — CBC WITH DIFFERENTIAL/PLATELET
Abs Immature Granulocytes: 0.01 10*3/uL (ref 0.00–0.07)
Basophils Absolute: 0.1 10*3/uL (ref 0.0–0.1)
Basophils Relative: 1 %
Eosinophils Absolute: 0.2 10*3/uL (ref 0.0–0.5)
Eosinophils Relative: 3 %
HCT: 40.2 % (ref 36.0–46.0)
Hemoglobin: 13.3 g/dL (ref 12.0–15.0)
Immature Granulocytes: 0 %
Lymphocytes Relative: 49 %
Lymphs Abs: 3 10*3/uL (ref 0.7–4.0)
MCH: 32.9 pg (ref 26.0–34.0)
MCHC: 33.1 g/dL (ref 30.0–36.0)
MCV: 99.5 fL (ref 80.0–100.0)
Monocytes Absolute: 0.5 10*3/uL (ref 0.1–1.0)
Monocytes Relative: 8 %
Neutro Abs: 2.3 10*3/uL (ref 1.7–7.7)
Neutrophils Relative %: 39 %
Platelets: 199 10*3/uL (ref 150–400)
RBC: 4.04 MIL/uL (ref 3.87–5.11)
RDW: 13.5 % (ref 11.5–15.5)
WBC: 5.9 10*3/uL (ref 4.0–10.5)
nRBC: 0 % (ref 0.0–0.2)

## 2022-04-05 LAB — COMPREHENSIVE METABOLIC PANEL
ALT: 12 U/L (ref 0–44)
AST: 19 U/L (ref 15–41)
Albumin: 3.7 g/dL (ref 3.5–5.0)
Alkaline Phosphatase: 85 U/L (ref 38–126)
Anion gap: 6 (ref 5–15)
BUN: 14 mg/dL (ref 8–23)
CO2: 27 mmol/L (ref 22–32)
Calcium: 8.6 mg/dL — ABNORMAL LOW (ref 8.9–10.3)
Chloride: 104 mmol/L (ref 98–111)
Creatinine, Ser: 0.7 mg/dL (ref 0.44–1.00)
GFR, Estimated: >60 mL/min (ref >60)
Glucose, Bld: 111 mg/dL — ABNORMAL HIGH (ref 70–99)
Potassium: 3.6 mmol/L (ref 3.5–5.1)
Sodium: 137 mmol/L (ref 135–145)
Total Bilirubin: 0.5 mg/dL (ref 0.3–1.2)
Total Protein: 7 g/dL (ref 6.5–8.1)

## 2022-04-05 LAB — FOLATE: Folate: 14.3 ng/mL (ref >5.9)

## 2022-04-05 LAB — VITAMIN B12: Vitamin B-12: 627 pg/mL (ref 180–914)

## 2022-04-06 LAB — COPPER, SERUM: Copper: 112 ug/dL (ref 80–158)

## 2022-04-07 ENCOUNTER — Encounter: Payer: Self-pay | Admitting: Oncology

## 2022-04-07 ENCOUNTER — Inpatient Hospital Stay (HOSPITAL_BASED_OUTPATIENT_CLINIC_OR_DEPARTMENT_OTHER): Payer: Medicare Other | Admitting: Oncology

## 2022-04-07 VITALS — BP 135/79 | HR 76 | Temp 96.3°F | Resp 18 | Wt 155.0 lb

## 2022-04-07 DIAGNOSIS — F17200 Nicotine dependence, unspecified, uncomplicated: Secondary | ICD-10-CM

## 2022-04-07 DIAGNOSIS — D7589 Other specified diseases of blood and blood-forming organs: Secondary | ICD-10-CM

## 2022-04-07 DIAGNOSIS — R718 Other abnormality of red blood cells: Secondary | ICD-10-CM | POA: Diagnosis not present

## 2022-04-07 MED ORDER — OYSTER SHELL CALCIUM/D3 500-5 MG-MCG PO TABS: 2.0000 | ORAL_TABLET | Freq: Every day | ORAL | 3 refills | Status: AC

## 2022-04-07 NOTE — Progress Notes (Signed)
Pt here for follow up. No new concerns voiced.   

## 2022-04-07 NOTE — Progress Notes (Signed)
?Hematology/Oncology Progress note ?Telephone:(336) [phone] [phone]) [phone] ?  ? ? ? ?Patient Care Team: ?Ellamae Sia, MD as PCP - General (Internal Medicine) ? ?REFERRING PROVIDER: ?[facility] ?Reason for Visit:  ?Follow-up for microcytosis ? ?HISTORY OF PRESENTING ILLNESS:  ?Rebecca Escobar is a  61 y.o.  female with PMH listed below who was referred to me for evaluation of macrocytosis.  ?Patient follows up with [facility] clinic GI group and has had labs done recently.  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  Reviewed patient's lab results in [place].  MCV has been above 100 since October 2016.  WBC count hemoglobin and platelet levels have been normal. ?She has Crohn's disease and has been on certolizumab for the past 10 years.  No new medications or herbal supplementations.  No easy bruising or bleeding events. ? ?CT lung scan done in May 2020. ?# chronic Crohn's disease has been at baseline.  Intermittent diarrhea. ? ?INTERVAL HISTORY ?Rebecca Escobar is a 61 y.o. female who has above history reviewed by me today presents for follow up visit for macrocytosis.  ?Chronic symptoms of Crohn's disease.  Unchanged. ?Patient discontinued omeprazole per device.  Today she has no new complaints. ? ?Review of Systems  ?Constitutional:  Negative for chills, fever, malaise/fatigue and weight loss.  ?HENT:  Negative for sore throat.   ?Eyes:  Negative for redness.  ?Respiratory:  Negative for cough, shortness of breath and wheezing.   ?Cardiovascular:  Negative for chest pain, palpitations and leg swelling.  ?Gastrointestinal:  Negative for abdominal pain, blood in stool, nausea and vomiting.  ?Genitourinary:  Negative for dysuria.  ?Musculoskeletal:  Negative for myalgias.  ?Skin:  Negative for rash.  ?Neurological:  Negative for dizziness, tingling and tremors.  ?Endo/Heme/Allergies:  Does not bruise/bleed easily.  ?Psychiatric/Behavioral:  Negative  for hallucinations.   ? ?MEDICAL HISTORY:  ?Past Medical History:  ?Diagnosis Date  ? Anemia   ? Anxiety   ? Cervical cancer ([place])   ? COPD (chronic obstructive pulmonary disease) ([place])   ? Pt states she has "some" copd  ? Crohn's disease ([place])   ? GERD (gastroesophageal reflux disease)   ? Squamous cell carcinoma of skin 06/10/2021  ? right proximal pretibial, treated with EDC  ? Tuberculosis   ? treated  ? ? ?SURGICAL HISTORY: ?Past Surgical History:  ?Procedure Laterality Date  ? ABDOMINAL HYSTERECTOMY    ? CESAREAN SECTION    ? COLON SURGERY    ? COLONOSCOPY WITH PROPOFOL N/A 03/02/2016  ? Procedure: COLONOSCOPY WITH PROPOFOL;  Surgeon: Manya Silvas, MD;  [facility] ENDOSCOPY;  Service: Endoscopy;  Laterality: N/A;  ? COLONOSCOPY WITH PROPOFOL N/A 11/20/2019  ? Procedure: COLONOSCOPY WITH PROPOFOL;  Surgeon: Toledo, Benay Pike, MD;  Location: [facility] ENDOSCOPY;  Service: Gastroenterology;  Laterality: N/A;  ? COLONOSCOPY WITH PROPOFOL N/A 07/09/2021  ? Procedure: COLONOSCOPY WITH PROPOFOL;  Surgeon: Lesly Rubenstein, MD;  Location: [facility] ENDOSCOPY;  Service: Endoscopy;  Laterality: N/A;  ? DILATION AND CURETTAGE OF UTERUS    ? ESOPHAGOGASTRODUODENOSCOPY (EGD) WITH PROPOFOL N/A 11/20/2019  ? Procedure: ESOPHAGOGASTRODUODENOSCOPY (EGD) WITH PROPOFOL;  Surgeon: Toledo, Benay Pike, MD;  Location: [facility] ENDOSCOPY;  Service: Gastroenterology;  Laterality: N/A;  ? ? ?SOCIAL HISTORY: ?Social History  ? ?Socioeconomic History  ? Marital status: Single  ?  Spouse name: Not on file  ? Number of children: Not on file  ? Years of education: Not on file  ? Highest  education level: Not on file  ?Occupational History  ? Not on file  ?Tobacco Use  ? Smoking status: Every Day  ?  Packs/day: 0.75  ?  Years: 40.00  ?  Pack years: 30.00  ?  Types: Cigarettes  ? Smokeless tobacco: Never  ?Vaping Use  ? Vaping Use: Never used  ?Substance and Sexual Activity  ? Alcohol use: No  ? Drug use: No  ? Sexual activity: Yes  ?Other Topics  Concern  ? Not on file  ?Social History Narrative  ? ** Merged History Encounter **  ?    ? ?Social Determinants of Health  ? ?Financial Resource Strain: Not on file  ?Food Insecurity: Not on file  ?Transportation Needs: Not on file  ?Physical Activity: Not on file  ?Stress: Not on file  ?Social Connections: Not on file  ?Intimate Partner Violence: Not on file  ? ? ?FAMILY HISTORY: ?Family History  ?Problem Relation Age of Onset  ? Kidney disease Neg Hx   ? Bladder Cancer Neg Hx   ? Breast cancer Neg Hx   ? ? ?ALLERGIES:  is allergic to nsaids. ? ?MEDICATIONS:  ?Current Outpatient Medications  ?Medication Sig Dispense Refill  ? albuterol (PROVENTIL HFA;VENTOLIN HFA) 108 (90 Base) MCG/ACT inhaler Inhale 2 puffs into the lungs every 6 (six) hours as needed for wheezing or shortness of breath. 1 Inhaler 0  ? calcium-vitamin D (OSCAL WITH D) 500-5 MG-MCG tablet Take 2 tablets by mouth daily. 180 tablet 3  ? Certolizumab Pegol 2 X 200 MG/ML KIT Inject 400 mg into the skin every 30 (thirty) days.    ? CIMZIA PREFILLED 2 X 200 MG/ML PSKT Inject into the skin.    ? FLOVENT HFA 110 MCG/ACT inhaler Inhale 2 puffs into the lungs 2 (two) times daily.    ? fluticasone-salmeterol (ADVAIR) 250-50 MCG/ACT AEPB Inhale 1 puff into the lungs in the morning and at bedtime.    ? Multiple Vitamin (MULTI-VITAMINS) TABS Take by mouth.    ? traMADol (ULTRAM) 50 MG tablet Take by mouth.    ? valACYclovir (VALTREX) 1000 MG tablet Take 1,000 mg by mouth daily.    ? Vitamin D, Ergocalciferol, (DRISDOL) 1.25 MG (50000 UT) CAPS capsule Take by mouth.    ? ?No current facility-administered medications for this visit.  ? ? ? ?PHYSICAL EXAMINATION: ?ECOG PERFORMANCE STATUS: 1 - Symptomatic but completely ambulatory ?Vitals:  ? 04/07/22 1336  ?BP: 135/79  ?Pulse: 76  ?Resp: 18  ?Temp: (!) 96.3 ?F (35.7 ?C)  ? ?Filed Weights  ? 04/07/22 1336  ?Weight: 155 lb (70.3 kg)  ? ? ?Physical Exam ?Constitutional:   ?   General: She is not in acute distress. ?    Appearance: She is not diaphoretic.  ?   Comments: Thin  ?HENT:  ?   Head: Normocephalic and atraumatic.  ?   Nose: Nose normal.  ?   Mouth/Throat:  ?   Pharynx: No oropharyngeal exudate.  ?Eyes:  ?   General: No scleral icterus. ?Cardiovascular:  ?   Rate and Rhythm: Normal rate.  ?Pulmonary:  ?   Effort: Pulmonary effort is normal. No respiratory distress.  ?Abdominal:  ?   General: There is no distension.  ?   Palpations: Abdomen is soft.  ?Musculoskeletal:     ?   General: Normal range of motion.  ?   Cervical back: Normal range of motion and neck supple.  ?Lymphadenopathy:  ?   Cervical: No cervical adenopathy.  ?  Skin: ?   Findings: No erythema or rash.  ?Neurological:  ?   Mental Status: She is alert and oriented to person, place, and time.  ?   Cranial Nerves: No cranial nerve deficit.  ?   Motor: No abnormal muscle tone.  ?   Coordination: Coordination normal.  ?Psychiatric:     ?   Mood and Affect: Mood and affect normal.  ? ? ? ?LABORATORY DATA:  ?I have reviewed the data as listed ?Lab Results  ?Component Value Date  ? WBC 5.9 04/05/2022  ? HGB 13.3 04/05/2022  ? HCT 40.2 04/05/2022  ? MCV 99.5 04/05/2022  ? PLT 199 04/05/2022  ? ?Recent Labs  ?  04/08/21 ?1959 07/28/21 ?1129 09/29/21 ?1300 04/05/22 ?1247  ?NA 139  --  140 137  ?K 3.9  --  3.8 3.6  ?CL 105  --  104 104  ?CO2 27  --  27 27  ?GLUCOSE 81  --  91 111*  ?BUN 16  --  14 14  ?CREATININE 0.83 0.60 0.64 0.70  ?CALCIUM 8.7*  --  8.4* 8.6*  ?GFRNONAA >60  --  >60 >60  ?PROT  --   --  6.8 7.0  ?ALBUMIN  --   --  3.7 3.7  ?AST  --   --  14* 19  ?ALT  --   --  8 12  ?ALKPHOS  --   --  91 85  ?BILITOT  --   --  0.2* 0.5  ? ? ? ?Labs from [facility].  ?TSH 1.651 ?Ferritin 95 ?MMA 152 ?11/01/2017 B12 546 ?01/08/2018 Homocysteine 20.5 ?01/17/2018 Folate 5.9  ? ?normal copper,  B12 level, , negative flowcytometry, normal LDH, no immature and morphological abnormality on smear review, ? ?ASSESSMENT & PLAN:  ?1. Macrocytosis without anemia   ?2.  Current every day smoker   ?3. Hypocalcemia   ? ?#Macrocytosis ?Labs reviewed and discussed with patient. ?MCV has normalized. ?Likely due to stop of omeprazole. ? ?Tobacco use, continue follow-up with lung

## 2022-04-09 LAB — VITAMIN B1: Vitamin B1 (Thiamine): 113.4 nmol/L (ref 66.5–200.0)

## 2022-04-25 ENCOUNTER — Other Ambulatory Visit: Payer: Self-pay

## 2022-04-25 ENCOUNTER — Emergency Department: Payer: Medicare Other

## 2022-04-25 ENCOUNTER — Emergency Department
Admission: EM | Admit: 2022-04-25 | Discharge: 2022-04-25 | Disposition: A | Discharge status: Home / Self Care | Payer: Medicare Other | Attending: Emergency Medicine | Admitting: Emergency Medicine

## 2022-04-25 DIAGNOSIS — X500XXA Overexertion from strenuous movement or load, initial encounter: Secondary | ICD-10-CM | POA: Diagnosis not present

## 2022-04-25 DIAGNOSIS — S22069A Unspecified fracture of T7-T8 vertebra, initial encounter for closed fracture: Secondary | ICD-10-CM | POA: Insufficient documentation

## 2022-04-25 DIAGNOSIS — Z8541 Personal history of malignant neoplasm of cervix uteri: Secondary | ICD-10-CM | POA: Insufficient documentation

## 2022-04-25 DIAGNOSIS — S29002A Unspecified injury of muscle and tendon of back wall of thorax, initial encounter: Secondary | ICD-10-CM | POA: Diagnosis present

## 2022-04-25 DIAGNOSIS — S22060A Wedge compression fracture of T7-T8 vertebra, initial encounter for closed fracture: Secondary | ICD-10-CM

## 2022-04-25 MED ORDER — LIDOCAINE 5 % EX PTCH: 1.0000 | MEDICATED_PATCH | Freq: Two times a day (BID) | CUTANEOUS | 11 refills | Status: AC

## 2022-04-25 MED ORDER — OXYCODONE-ACETAMINOPHEN 5-325 MG PO TABS: 1.0000 | ORAL_TABLET | ORAL | 0 refills | Status: AC | PRN

## 2022-04-25 MED ORDER — CYCLOBENZAPRINE HCL 10 MG PO TABS: 10.0000 mg | ORAL_TABLET | Freq: Three times a day (TID) | ORAL | 0 refills | Status: AC | PRN

## 2022-04-25 NOTE — ED Provider Notes (Incomplete)
? ?[facility] ?Provider Note ? ? ? Event Date/Time  ? First MD Initiated Contact with Patient 04/25/22 1242   ?  (approximate) ? ? ?History  ? ?Chief Complaint ?Back Pain ? ? ?HPI ?Rebecca Escobar is a 61 y.o. female, ***  ? ?*** Denies fever/chills, chest pain, shortness of breath, abdominal pain, flank pain, nausea/vomiting, diarrhea, urinary symptoms, headache, vision changes, hearing changes, rashes/lesions, numbness/tingling in upper or lower extremities, or dizziness/lightheadedness.  ? ?Per records review, *** ? ?History Limitations: *** ? ?    ? ? ?Physical Exam  ?Triage Vital Signs: ?ED Triage Vitals [04/25/22 1214]  ?Enc Vitals Group  ?   BP (!) 154/97  ?   Pulse Rate 77  ?   Resp 17  ?   Temp 98.7 ?F (37.1 ?C)  ?   Temp Source Oral  ?   SpO2 94 %  ?   Weight   ?   Height   ?   Head Circumference   ?   Peak Flow   ?   Pain Score   ?   Pain Loc   ?   Pain Edu?   ?   Excl. in [place]?   ? ? ?Most recent vital signs: ?Vitals:  ? 04/25/22 1214  ?BP: (!) 154/97  ?Pulse: 77  ?Resp: 17  ?Temp: 98.7 ?F (37.1 ?C)  ?SpO2: 94%  ? ? ?General: Awake, NAD. *** ?Skin: Warm, dry. No rashes or lesions. *** ?Eyes: PERRL. Conjunctivae normal. *** ?CV: Good peripheral perfusion. *** ?Resp: Normal effort. *** ?Abd: Soft, non-tender. No distention. *** ?Neuro: At baseline. No gross neurological deficits. *** ? ?Focused Exam: *** ? ?Physical Exam ? ? ? ?ED Results / Procedures / Treatments  ?Labs ?(all labs ordered are listed, but only abnormal results are displayed) ?Labs Reviewed - No data to display ? ? ?EKG ?*** ? ? ?RADIOLOGY ? ?ED Provider Interpretation: *** ? ?CT Thoracic Spine Wo Contrast ? ?Result Date: 04/25/2022 ?CLINICAL DATA:  Mid to low back pain with increased fracture risk. Compression fracture suspected. EXAM: CT THORACIC AND LUMBAR SPINE WITHOUT CONTRAST TECHNIQUE: Multidetector CT imaging of the thoracic and lumbar spine was performed without contrast. Multiplanar CT image reconstructions  were also generated. RADIATION DOSE REDUCTION: This exam was performed according to the departmental dose-optimization program which includes automated exposure control, adjustment of the mA and/or kV according to patient size and/or use of iterative reconstruction technique. COMPARISON:  Chest CT 04/23/2021.  Abdominopelvic CT 07/28/2021. FINDINGS: CT THORACIC SPINE FINDINGS Alignment: Normal. Vertebrae: There is new mild loss of vertebral body height at T8 associated with acute fracture involving the superior endplate. There is approximately 20% loss of vertebral body height. No osseous retropulsion. Scattered endplate degenerative changes elsewhere in the thoracic spine are stable, and no other acute osseous findings are evident. Paraspinal and other soft tissues: Anterior paraspinal edema at T8 corresponding with the acute fracture. No focal hematoma. Moderate emphysematous changes in both lungs. Aortic atherosclerosis. Disc levels: Multilevel endplate degenerative changes with vacuum disc phenomenon, endplate osteophytes and scattered small disc protrusions. The disc protrusions are most prominent paracentrally on the right at T5-6 and T6-7 and centrally at T7-8. No large disc herniation or high-grade spinal stenosis identified. CT LUMBAR SPINE FINDINGS Segmentation: There are 5 lumbar type vertebral bodies. Alignment: Normal. Vertebrae: No evidence of acute fracture, traumatic subluxation or pars defect. Mild sacroiliac degenerative changes bilaterally. No evidence of sacroiliitis. Paraspinal and other soft tissues: No acute paraspinal  findings identified in the lumbar region. There is aortic and branch vessel atherosclerosis. Previous ileocolonic anastomosis noted. Disc levels: Mild chronic spondylosis. At L1-2, there is loss of disc height with annular disc bulging and endplate osteophytes. No significant disc space findings at L2-3. L3-4: Loss of disc height with broad-based left foraminal disc protrusion  contributing to chronic left foraminal narrowing. L4-5: Mild disc bulging, facet and ligamentous hypertrophy. No significant spinal stenosis. L5-S1: Chronic degenerative disc disease with loss of disc height, vacuum phenomenon and endplate osteophytes. Mild chronic foraminal narrowing bilaterally. IMPRESSION: 1. Acute mild superior endplate compression fracture at T8 without associated osseous retropulsion. 2. No other acute osseous findings in the thoracolumbar spine. The alignment is normal. 3. Mild chronic thoracolumbar spondylosis, similar to previous CTs of the chest, abdomen and pelvis. No large disc herniation or high-grade spinal stenosis identified. Electronically Signed   By: Richardean Sale M.D.   On: 04/25/2022 13:39  ? ?CT Lumbar Spine Wo Contrast ? ?Result Date: 04/25/2022 ?CLINICAL DATA:  Mid to low back pain with increased fracture risk. Compression fracture suspected. EXAM: CT THORACIC AND LUMBAR SPINE WITHOUT CONTRAST TECHNIQUE: Multidetector CT imaging of the thoracic and lumbar spine was performed without contrast. Multiplanar CT image reconstructions were also generated. RADIATION DOSE REDUCTION: This exam was performed according to the departmental dose-optimization program which includes automated exposure control, adjustment of the mA and/or kV according to patient size and/or use of iterative reconstruction technique. COMPARISON:  Chest CT 04/23/2021.  Abdominopelvic CT 07/28/2021. FINDINGS: CT THORACIC SPINE FINDINGS Alignment: Normal. Vertebrae: There is new mild loss of vertebral body height at T8 associated with acute fracture involving the superior endplate. There is approximately 20% loss of vertebral body height. No osseous retropulsion. Scattered endplate degenerative changes elsewhere in the thoracic spine are stable, and no other acute osseous findings are evident. Paraspinal and other soft tissues: Anterior paraspinal edema at T8 corresponding with the acute fracture. No focal  hematoma. Moderate emphysematous changes in both lungs. Aortic atherosclerosis. Disc levels: Multilevel endplate degenerative changes with vacuum disc phenomenon, endplate osteophytes and scattered small disc protrusions. The disc protrusions are most prominent paracentrally on the right at T5-6 and T6-7 and centrally at T7-8. No large disc herniation or high-grade spinal stenosis identified. CT LUMBAR SPINE FINDINGS Segmentation: There are 5 lumbar type vertebral bodies. Alignment: Normal. Vertebrae: No evidence of acute fracture, traumatic subluxation or pars defect. Mild sacroiliac degenerative changes bilaterally. No evidence of sacroiliitis. Paraspinal and other soft tissues: No acute paraspinal findings identified in the lumbar region. There is aortic and branch vessel atherosclerosis. Previous ileocolonic anastomosis noted. Disc levels: Mild chronic spondylosis. At L1-2, there is loss of disc height with annular disc bulging and endplate osteophytes. No significant disc space findings at L2-3. L3-4: Loss of disc height with broad-based left foraminal disc protrusion contributing to chronic left foraminal narrowing. L4-5: Mild disc bulging, facet and ligamentous hypertrophy. No significant spinal stenosis. L5-S1: Chronic degenerative disc disease with loss of disc height, vacuum phenomenon and endplate osteophytes. Mild chronic foraminal narrowing bilaterally. IMPRESSION: 1. Acute mild superior endplate compression fracture at T8 without associated osseous retropulsion. 2. No other acute osseous findings in the thoracolumbar spine. The alignment is normal. 3. Mild chronic thoracolumbar spondylosis, similar to previous CTs of the chest, abdomen and pelvis. No large disc herniation or high-grade spinal stenosis identified. Electronically Signed   By: Richardean Sale M.D.   On: 04/25/2022 13:39   ? ?PROCEDURES: ? ?Critical Care performed: *** ? ?Procedures ? ? ? ?  MEDICATIONS ORDERED IN ED: ?Medications - No data  to display ? ? ?IMPRESSION / MDM / ASSESSMENT AND PLAN / ED COURSE  ?I reviewed the triage vital signs and the nursing notes. ?             ?               ? ?Differential diagnosis includes, but is not limit

## 2022-04-25 NOTE — Discharge Instructions (Addendum)
-  Follow-up with the neurosurgeon listed above, as discussed ?-Utilize lidocaine patches and cyclobenzaprine as needed for pain.  Utilize oxycodone sparingly. ?-Return to the emergency department anytime if you begin to experience any new or worsening symptoms. ?

## 2022-04-25 NOTE — ED Triage Notes (Signed)
Pt c/o mid back pain since lifting a TV 3 days ago.  ?

## 2022-04-25 NOTE — ED Provider Notes (Signed)
? ?[facility] ?Provider Note ? ? ? Event Date/Time  ? First MD Initiated Contact with Patient 04/25/22 1242   ?  (approximate) ? ? ?History  ? ?Chief Complaint ?Back Pain ? ? ?HPI ?Rebecca Escobar is a 61 y.o. female, history of Crohn's disease, osteopenia, GERD, anxiety, anemia, cervical cancer, presents to the emergency department for evaluation of mid back pain.  Patient states that she was lifting a TV approximately 3 days ago.  Since then she has had pain along the middle of her back, extending bilaterally into the paraspinal muscles.  She is still ambulatory at this time.  Denies fever/chills, chest pain, shortness of breath, abdominal pain, flank pain, nausea/vomiting, diarrhea, urinary symptoms, bowel/bladder dysfunction, rash/lesions, numbness/tingling in upper or lower extremities, or dizziness/lightheadedness. ? ?History Limitations: No limitations. ? ?    ? ? ?Physical Exam  ?Triage Vital Signs: ?ED Triage Vitals [04/25/22 1214]  ?Enc Vitals Group  ?   BP (!) 154/97  ?   Pulse Rate 77  ?   Resp 17  ?   Temp 98.7 ?F (37.1 ?C)  ?   Temp Source Oral  ?   SpO2 94 %  ?   Weight   ?   Height   ?   Head Circumference   ?   Peak Flow   ?   Pain Score   ?   Pain Loc   ?   Pain Edu?   ?   Excl. in [place]?   ? ? ?Most recent vital signs: ?Vitals:  ? 04/25/22 1214  ?BP: (!) 154/97  ?Pulse: 77  ?Resp: 17  ?Temp: 98.7 ?F (37.1 ?C)  ?SpO2: 94%  ? ? ?General: Awake, NAD. ?Skin: Warm, dry. No rashes or lesions.  ?Eyes: PERRL. Conjunctivae normal.  ?CV: Good peripheral perfusion.  ?Resp: Normal effort.  ?Abd: Soft, non-tender. No distention.  ?Neuro: At baseline. No gross neurological deficits.  ? ?Focused Exam: No significant bony tenderness along the midline of the spine.  Notable tightness along the paraspinal muscles bilaterally, particular in the thoracic region. ? ?Physical Exam ? ? ? ?ED Results / Procedures / Treatments  ?Labs ?(all labs ordered are listed, but only abnormal results are  displayed) ?Labs Reviewed - No data to display ? ? ?EKG ?N/A. ? ? ?RADIOLOGY ? ?ED Provider Interpretation: I personally reviewed and interpreted these images.  Compression fracture appreciated along T8. ? ?CT Thoracic Spine Wo Contrast ? ?Result Date: 04/25/2022 ?CLINICAL DATA:  Mid to low back pain with increased fracture risk. Compression fracture suspected. EXAM: CT THORACIC AND LUMBAR SPINE WITHOUT CONTRAST TECHNIQUE: Multidetector CT imaging of the thoracic and lumbar spine was performed without contrast. Multiplanar CT image reconstructions were also generated. RADIATION DOSE REDUCTION: This exam was performed according to the departmental dose-optimization program which includes automated exposure control, adjustment of the mA and/or kV according to patient size and/or use of iterative reconstruction technique. COMPARISON:  Chest CT 04/23/2021.  Abdominopelvic CT 07/28/2021. FINDINGS: CT THORACIC SPINE FINDINGS Alignment: Normal. Vertebrae: There is new mild loss of vertebral body height at T8 associated with acute fracture involving the superior endplate. There is approximately 20% loss of vertebral body height. No osseous retropulsion. Scattered endplate degenerative changes elsewhere in the thoracic spine are stable, and no other acute osseous findings are evident. Paraspinal and other soft tissues: Anterior paraspinal edema at T8 corresponding with the acute fracture. No focal hematoma. Moderate emphysematous changes in both lungs. Aortic atherosclerosis. Disc levels: Multilevel  endplate degenerative changes with vacuum disc phenomenon, endplate osteophytes and scattered small disc protrusions. The disc protrusions are most prominent paracentrally on the right at T5-6 and T6-7 and centrally at T7-8. No large disc herniation or high-grade spinal stenosis identified. CT LUMBAR SPINE FINDINGS Segmentation: There are 5 lumbar type vertebral bodies. Alignment: Normal. Vertebrae: No evidence of acute fracture,  traumatic subluxation or pars defect. Mild sacroiliac degenerative changes bilaterally. No evidence of sacroiliitis. Paraspinal and other soft tissues: No acute paraspinal findings identified in the lumbar region. There is aortic and branch vessel atherosclerosis. Previous ileocolonic anastomosis noted. Disc levels: Mild chronic spondylosis. At L1-2, there is loss of disc height with annular disc bulging and endplate osteophytes. No significant disc space findings at L2-3. L3-4: Loss of disc height with broad-based left foraminal disc protrusion contributing to chronic left foraminal narrowing. L4-5: Mild disc bulging, facet and ligamentous hypertrophy. No significant spinal stenosis. L5-S1: Chronic degenerative disc disease with loss of disc height, vacuum phenomenon and endplate osteophytes. Mild chronic foraminal narrowing bilaterally. IMPRESSION: 1. Acute mild superior endplate compression fracture at T8 without associated osseous retropulsion. 2. No other acute osseous findings in the thoracolumbar spine. The alignment is normal. 3. Mild chronic thoracolumbar spondylosis, similar to previous CTs of the chest, abdomen and pelvis. No large disc herniation or high-grade spinal stenosis identified. Electronically Signed   By: Richardean Sale M.D.   On: 04/25/2022 13:39  ? ?CT Lumbar Spine Wo Contrast ? ?Result Date: 04/25/2022 ?CLINICAL DATA:  Mid to low back pain with increased fracture risk. Compression fracture suspected. EXAM: CT THORACIC AND LUMBAR SPINE WITHOUT CONTRAST TECHNIQUE: Multidetector CT imaging of the thoracic and lumbar spine was performed without contrast. Multiplanar CT image reconstructions were also generated. RADIATION DOSE REDUCTION: This exam was performed according to the departmental dose-optimization program which includes automated exposure control, adjustment of the mA and/or kV according to patient size and/or use of iterative reconstruction technique. COMPARISON:  Chest CT 04/23/2021.   Abdominopelvic CT 07/28/2021. FINDINGS: CT THORACIC SPINE FINDINGS Alignment: Normal. Vertebrae: There is new mild loss of vertebral body height at T8 associated with acute fracture involving the superior endplate. There is approximately 20% loss of vertebral body height. No osseous retropulsion. Scattered endplate degenerative changes elsewhere in the thoracic spine are stable, and no other acute osseous findings are evident. Paraspinal and other soft tissues: Anterior paraspinal edema at T8 corresponding with the acute fracture. No focal hematoma. Moderate emphysematous changes in both lungs. Aortic atherosclerosis. Disc levels: Multilevel endplate degenerative changes with vacuum disc phenomenon, endplate osteophytes and scattered small disc protrusions. The disc protrusions are most prominent paracentrally on the right at T5-6 and T6-7 and centrally at T7-8. No large disc herniation or high-grade spinal stenosis identified. CT LUMBAR SPINE FINDINGS Segmentation: There are 5 lumbar type vertebral bodies. Alignment: Normal. Vertebrae: No evidence of acute fracture, traumatic subluxation or pars defect. Mild sacroiliac degenerative changes bilaterally. No evidence of sacroiliitis. Paraspinal and other soft tissues: No acute paraspinal findings identified in the lumbar region. There is aortic and branch vessel atherosclerosis. Previous ileocolonic anastomosis noted. Disc levels: Mild chronic spondylosis. At L1-2, there is loss of disc height with annular disc bulging and endplate osteophytes. No significant disc space findings at L2-3. L3-4: Loss of disc height with broad-based left foraminal disc protrusion contributing to chronic left foraminal narrowing. L4-5: Mild disc bulging, facet and ligamentous hypertrophy. No significant spinal stenosis. L5-S1: Chronic degenerative disc disease with loss of disc height, vacuum phenomenon and endplate osteophytes.  Mild chronic foraminal narrowing bilaterally. IMPRESSION: 1.  Acute mild superior endplate compression fracture at T8 without associated osseous retropulsion. 2. No other acute osseous findings in the thoracolumbar spine. The alignment is normal. 3. Mild chronic thor

## 2022-05-20 ENCOUNTER — Other Ambulatory Visit: Payer: Self-pay | Admitting: *Deleted

## 2022-05-20 DIAGNOSIS — F17210 Nicotine dependence, cigarettes, uncomplicated: Secondary | ICD-10-CM

## 2022-05-20 DIAGNOSIS — Z87891 Personal history of nicotine dependence: Secondary | ICD-10-CM

## 2022-05-20 DIAGNOSIS — Z122 Encounter for screening for malignant neoplasm of respiratory organs: Secondary | ICD-10-CM

## 2022-05-20 NOTE — Progress Notes (Signed)
Ct ch

## 2022-05-30 ENCOUNTER — Ambulatory Visit
Admission: RE | Admit: 2022-05-30 | Discharge: 2022-05-30 | Disposition: A | Discharge status: Home / Self Care | Payer: Medicare Other | Source: Ambulatory Visit | Attending: Acute Care | Admitting: Acute Care

## 2022-05-30 DIAGNOSIS — Z122 Encounter for screening for malignant neoplasm of respiratory organs: Secondary | ICD-10-CM | POA: Diagnosis present

## 2022-05-30 DIAGNOSIS — Z87891 Personal history of nicotine dependence: Secondary | ICD-10-CM | POA: Insufficient documentation

## 2022-05-30 DIAGNOSIS — F17210 Nicotine dependence, cigarettes, uncomplicated: Secondary | ICD-10-CM | POA: Insufficient documentation

## 2022-05-31 ENCOUNTER — Other Ambulatory Visit: Payer: Self-pay | Admitting: Family Medicine

## 2022-05-31 DIAGNOSIS — Z1231 Encounter for screening mammogram for malignant neoplasm of breast: Secondary | ICD-10-CM

## 2022-06-02 ENCOUNTER — Other Ambulatory Visit: Payer: Self-pay | Admitting: Acute Care

## 2022-06-02 DIAGNOSIS — F17210 Nicotine dependence, cigarettes, uncomplicated: Secondary | ICD-10-CM

## 2022-06-02 DIAGNOSIS — Z122 Encounter for screening for malignant neoplasm of respiratory organs: Secondary | ICD-10-CM

## 2022-06-02 DIAGNOSIS — Z87891 Personal history of nicotine dependence: Secondary | ICD-10-CM

## 2022-06-23 ENCOUNTER — Ambulatory Visit
Admission: RE | Admit: 2022-06-23 | Discharge: 2022-06-23 | Disposition: A | Discharge status: Home / Self Care | Payer: Medicare Other | Source: Ambulatory Visit | Attending: Family Medicine | Admitting: Family Medicine

## 2022-06-23 DIAGNOSIS — Z1231 Encounter for screening mammogram for malignant neoplasm of breast: Secondary | ICD-10-CM | POA: Insufficient documentation

## 2022-11-08 ENCOUNTER — Ambulatory Visit: Payer: Medicare Other | Admitting: Dermatology

## 2022-11-25 ENCOUNTER — Encounter: Payer: Self-pay | Admitting: *Deleted

## 2022-11-25 ENCOUNTER — Ambulatory Visit
Admission: RE | Admit: 2022-11-25 | Discharge: 2022-11-25 | Disposition: A | Discharge status: Home / Self Care | Payer: Medicare Other | Source: Ambulatory Visit | Attending: Gastroenterology | Admitting: Gastroenterology

## 2022-11-25 ENCOUNTER — Ambulatory Visit: Payer: Medicare Other | Admitting: Anesthesiology

## 2022-11-25 ENCOUNTER — Encounter
Admission: RE | Disposition: A | Discharge status: Home / Self Care | Payer: Self-pay | Source: Ambulatory Visit | Attending: Gastroenterology

## 2022-11-25 DIAGNOSIS — K2100 Gastro-esophageal reflux disease with esophagitis, without bleeding: Secondary | ICD-10-CM | POA: Insufficient documentation

## 2022-11-25 DIAGNOSIS — K640 First degree hemorrhoids: Secondary | ICD-10-CM | POA: Diagnosis not present

## 2022-11-25 DIAGNOSIS — R1310 Dysphagia, unspecified: Secondary | ICD-10-CM | POA: Insufficient documentation

## 2022-11-25 DIAGNOSIS — K621 Rectal polyp: Secondary | ICD-10-CM | POA: Diagnosis not present

## 2022-11-25 DIAGNOSIS — Z8541 Personal history of malignant neoplasm of cervix uteri: Secondary | ICD-10-CM | POA: Insufficient documentation

## 2022-11-25 DIAGNOSIS — K50819 Crohn's disease of both small and large intestine with unspecified complications: Secondary | ICD-10-CM | POA: Insufficient documentation

## 2022-11-25 DIAGNOSIS — K449 Diaphragmatic hernia without obstruction or gangrene: Secondary | ICD-10-CM | POA: Diagnosis not present

## 2022-11-25 DIAGNOSIS — F17210 Nicotine dependence, cigarettes, uncomplicated: Secondary | ICD-10-CM | POA: Diagnosis not present

## 2022-11-25 DIAGNOSIS — J449 Chronic obstructive pulmonary disease, unspecified: Secondary | ICD-10-CM | POA: Insufficient documentation

## 2022-11-25 DIAGNOSIS — K6389 Other specified diseases of intestine: Secondary | ICD-10-CM | POA: Insufficient documentation

## 2022-11-25 HISTORY — PX: COLONOSCOPY WITH PROPOFOL: SHX5780

## 2022-11-25 HISTORY — PX: ESOPHAGOGASTRODUODENOSCOPY (EGD) WITH PROPOFOL: SHX5813

## 2022-11-25 SURGERY — COLONOSCOPY WITH PROPOFOL
Anesthesia: General

## 2022-11-25 MED ORDER — SODIUM CHLORIDE 0.9 % IV SOLN: INTRAVENOUS | Status: DC

## 2022-11-25 MED ORDER — PHENYLEPHRINE 80 MCG/ML (10ML) SYRINGE FOR IV PUSH (FOR BLOOD PRESSURE SUPPORT)
PREFILLED_SYRINGE | INTRAVENOUS | Status: DC | PRN
  Administered 2022-11-25 (×2): 80 ug via INTRAVENOUS

## 2022-11-25 MED ORDER — PROPOFOL 10 MG/ML IV BOLUS
INTRAVENOUS | Status: DC | PRN
  Administered 2022-11-25 (×2): 40 mg via INTRAVENOUS
  Administered 2022-11-25: 50 mg via INTRAVENOUS
  Administered 2022-11-25: 100 mg via INTRAVENOUS
  Administered 2022-11-25 (×2): 50 mg via INTRAVENOUS
  Administered 2022-11-25: 40 mg via INTRAVENOUS

## 2022-11-25 MED ORDER — LIDOCAINE HCL (CARDIAC) PF 100 MG/5ML IV SOSY
PREFILLED_SYRINGE | INTRAVENOUS | Status: DC | PRN
  Administered 2022-11-25: 100 mg via INTRAVENOUS

## 2022-11-25 NOTE — Op Note (Signed)
[facility] Gastroenterology Patient Name: Rebecca Escobar Procedure Date: 11/25/2022 8:52 AM MRN: [id number] Account #: [id number] Date of Birth: Sep 09, 1961 Admit Type: Outpatient Age: 61 Room: [facility] ENDO ROOM 3 Gender: Female Note Status: Finalized Instrument Name: Upper Endoscope [id number] Procedure:             Upper GI endoscopy Indications:           Dysphagia, Crohn's disease Providers:             Andrey Farmer MD, MD Referring MD:          Remus Blake MD, MD (Referring MD) Medicines:             Monitored Anesthesia Care Complications:         No immediate complications. Procedure:             Pre-Anesthesia Assessment:                        - Prior to the procedure, a History and Physical was                         performed, and patient medications and allergies were                         reviewed. The patient is competent. The risks and                         benefits of the procedure and the sedation options and                         risks were discussed with the patient. All questions                         were answered and informed consent was obtained.                         Patient identification and proposed procedure were                         verified by the physician, the nurse, the                         anesthesiologist, the anesthetist and the technician                         in the endoscopy suite. Mental Status Examination:                         alert and oriented. Airway Examination: normal                         oropharyngeal airway and neck mobility. Respiratory                         Examination: clear to auscultation. CV Examination:                         normal. Prophylactic Antibiotics: The patient does not  require prophylactic antibiotics. Prior                         Anticoagulants: The patient has taken no anticoagulant                         or antiplatelet agents. ASA Grade  Assessment: III - A                         patient with severe systemic disease. After reviewing                         the risks and benefits, the patient was deemed in                         satisfactory condition to undergo the procedure. The                         anesthesia plan was to use monitored anesthesia care                         (MAC). Immediately prior to administration of                         medications, the patient was re-assessed for adequacy                         to receive sedatives. The heart rate, respiratory                         rate, oxygen saturations, blood pressure, adequacy of                         pulmonary ventilation, and response to care were                         monitored throughout the procedure. The physical                         status of the patient was re-assessed after the                         procedure.                        After obtaining informed consent, the endoscope was                         passed under direct vision. Throughout the procedure,                         the patient's blood pressure, pulse, and oxygen                         saturations were monitored continuously. The Endoscope                         was introduced through the mouth, and advanced to the  second part of duodenum. The upper GI endoscopy was                         accomplished without difficulty. The patient tolerated                         the procedure well. Findings:      LA Grade B (one or more mucosal breaks greater than 5 mm, not extending       between the tops of two mucosal folds) esophagitis with no bleeding was       found.      A 3 cm hiatal hernia was present.      The entire examined stomach was normal.      The examined duodenum was normal. Impression:            - LA Grade B reflux esophagitis with no bleeding.                        - 3 cm hiatal hernia.                        - Normal  stomach.                        - Normal examined duodenum.                        - No specimens collected. Recommendation:        - Perform a colonoscopy today.                        - Use Protonix (pantoprazole) 20 mg PO daily. If                         persistent dysphagia then would consider barium                         swallow evaluation. Procedure Code(s):     --- Professional ---                        [phone], Esophagogastroduodenoscopy, flexible,                         transoral; diagnostic, including collection of                         specimen(s) by brushing or washing, when performed                         (separate procedure) Diagnosis Code(s):     --- Professional ---                        K21.00, Gastro-esophageal reflux disease with                         esophagitis, without bleeding                        K44.9, Diaphragmatic hernia without obstruction or  gangrene                        R13.10, Dysphagia, unspecified                        K50.90, Crohn's disease, unspecified, without                         complications CPT copyright 2022 [employer]. All rights reserved. The codes documented in this report are preliminary and upon coder review may  be revised to meet current compliance requirements. Andrey Farmer MD, MD 11/25/2022 9:47:12 AM Number of Addenda: 0 Note Initiated On: 11/25/2022 8:52 AM Estimated Blood Loss:  Estimated blood loss: none.      [facility]

## 2022-11-25 NOTE — Anesthesia Preprocedure Evaluation (Addendum)
Anesthesia Evaluation  Patient identified by MRN, date of birth, ID band Patient awake    Reviewed: Allergy & Precautions, NPO status , Patient's Chart, lab work & pertinent test results  History of Anesthesia Complications Negative for: history of anesthetic complications  Airway Mallampati: I   Neck ROM: Full    Dental  (+) Upper Dentures, Partial Lower   Pulmonary COPD, Current Smoker (1/2 ppd)Patient did not abstain from smoking.   Pulmonary exam normal breath sounds clear to auscultation       Cardiovascular Exercise Tolerance: Good negative cardio ROS Normal cardiovascular exam Rhythm:Regular Rate:Normal  Echo 04/20/21: NORMAL LEFT VENTRICULAR SYSTOLIC FUNCTION   WITH MILD LVH  NORMAL RIGHT VENTRICULAR SYSTOLIC FUNCTION  NO VALVULAR STENOSIS  MILD AR, TR  TRIVIAL MR  EF 60%    Neuro/Psych  PSYCHIATRIC DISORDERS Anxiety     negative neurological ROS     GI/Hepatic ,GERD  ,,Crohn disease   Endo/Other  negative endocrine ROS    Renal/GU negative Renal ROS     Musculoskeletal   Abdominal   Peds  Hematology  (+) Blood dyscrasia, anemia   Anesthesia Other Findings   Reproductive/Obstetrics Cervical CA                             Anesthesia Physical Anesthesia Plan  ASA: 2  Anesthesia Plan: General   Post-op Pain Management:    Induction: Intravenous  PONV Risk Score and Plan: 2 and Propofol infusion, TIVA and Treatment may vary due to age or medical condition  Airway Management Planned: Natural Airway  Additional Equipment:   Intra-op Plan:   Post-operative Plan:   Informed Consent: I have reviewed the patients History and Physical, chart, labs and discussed the procedure including the risks, benefits and alternatives for the proposed anesthesia with the patient or authorized representative who has indicated his/her understanding and acceptance.       Plan  Discussed with: CRNA  Anesthesia Plan Comments: (LMA/GETA backup discussed.  Patient consented for risks of anesthesia including but not limited to:  - adverse reactions to medications - damage to eyes, teeth, lips or other oral mucosa - nerve damage due to positioning  - sore throat or hoarseness - damage to heart, brain, nerves, lungs, other parts of body or loss of life  Informed patient about role of CRNA in peri- and intra-operative care.  Patient voiced understanding.)        Anesthesia Quick Evaluation

## 2022-11-25 NOTE — Transfer of Care (Signed)
Immediate Anesthesia Transfer of Care Note  Patient: Rebecca Escobar  Procedure(s) Performed: COLONOSCOPY WITH PROPOFOL ESOPHAGOGASTRODUODENOSCOPY (EGD) WITH PROPOFOL  Patient Location: Endoscopy Unit  Anesthesia Type:General  Level of Consciousness: drowsy  Airway & Oxygen Therapy: Patient Spontanous Breathing and Patient connected to nasal cannula oxygen  Post-op Assessment: Report given to RN, Post -op Vital signs reviewed and stable, and Patient moving all extremities  Post vital signs: Reviewed and stable  Last Vitals:  Vitals Value Taken Time  BP 114/69 11/25/22 0945  Temp    Pulse 70 11/25/22 0946  Resp 18 11/25/22 0946  SpO2 98 % 11/25/22 0946  Vitals shown include unvalidated device data.  Last Pain:  Vitals:   11/25/22 0945  TempSrc:   PainSc: 0-No pain         Complications: No notable events documented.

## 2022-11-25 NOTE — H&P (Signed)
Outpatient short stay form Pre-procedure 11/25/2022  Lesly Rubenstein, MD  Primary Physician: Ellamae Sia, MD  Reason for visit:  History of crohns disease  History of present illness:    61 y/o lady with history of multiple surgeries for crohns disease here for disease activity assessment. She takes cimzia. No blood thinners. No family history of GI malignancies. She smokes 1/2 ppd. Endorses dysphagia.    Current Facility-Administered Medications:    0.9 %  sodium chloride infusion, , Intravenous, Continuous, Jimmi Sidener, Hilton Cork, MD, Last Rate: 20 mL/hr at 11/25/22 0849, New Bag at 11/25/22 0849  Medications Prior to Admission  Medication Sig Dispense Refill Last Dose   atorvastatin (LIPITOR) 10 MG tablet Take 10 mg by mouth daily.   11/24/2022   omeprazole (PRILOSEC) 20 MG capsule Take 20 mg by mouth daily.   11/24/2022   albuterol (PROVENTIL HFA;VENTOLIN HFA) 108 (90 Base) MCG/ACT inhaler Inhale 2 puffs into the lungs every 6 (six) hours as needed for wheezing or shortness of breath. 1 Inhaler 0    calcium-vitamin D (OSCAL WITH D) 500-5 MG-MCG tablet Take 2 tablets by mouth daily. 326 tablet 3    Certolizumab Pegol 2 X 200 MG/ML KIT Inject 400 mg into the skin every 30 (thirty) days.      CIMZIA PREFILLED 2 X 200 MG/ML PSKT Inject into the skin.      FLOVENT HFA 110 MCG/ACT inhaler Inhale 2 puffs into the lungs 2 (two) times daily.      fluticasone-salmeterol (ADVAIR) 250-50 MCG/ACT AEPB Inhale 1 puff into the lungs in the morning and at bedtime.      lidocaine (LIDODERM) 5 % Place 1 patch onto the skin every 12 (twelve) hours. Remove & Discard patch within 12 hours or as directed by MD 60 patch 11    Multiple Vitamin (MULTI-VITAMINS) TABS Take by mouth.      valACYclovir (VALTREX) 1000 MG tablet Take 1,000 mg by mouth daily.      Vitamin D, Ergocalciferol, (DRISDOL) 1.25 MG (50000 UT) CAPS capsule Take by mouth.        Allergies  Allergen Reactions   Nsaids     Due to  Crohns disease     Past Medical History:  Diagnosis Date   Anemia    Anxiety    Cervical cancer ([place])    COPD (chronic obstructive pulmonary disease) ([place])    Pt states she has "some" copd   Crohn's disease ([place])    GERD (gastroesophageal reflux disease)    Squamous cell carcinoma of skin 06/10/2021   right proximal pretibial, treated with EDC   Tuberculosis    treated    Review of systems:  Otherwise negative.    Physical Exam  Gen: Alert, oriented. Appears stated age.  HEENT: PERRLA. Lungs: No respiratory distress CV: RRR Abd: soft, benign, no masses Ext: No edema    Planned procedures: Proceed with EGD/colonoscopy. The patient understands the nature of the planned procedure, indications, risks, alternatives and potential complications including but not limited to bleeding, infection, perforation, damage to internal organs and possible oversedation/side effects from anesthesia. The patient agrees and gives consent to proceed.  Please refer to procedure notes for findings, recommendations and patient disposition/instructions.     Lesly Rubenstein, MD [facility] Gastroenterology

## 2022-11-25 NOTE — Op Note (Signed)
[facility] Gastroenterology Patient Name: Rebecca Escobar Procedure Date: 11/25/2022 8:51 AM MRN: [id number] Account #: [id number] Date of Birth: 1961-09-28 Admit Type: Outpatient Age: 60 Room: [facility] ENDO ROOM 3 Gender: Female Note Status: Finalized Instrument Name: Jasper Riling [id number] Procedure:             Colonoscopy Indications:           Crohn's disease of the small bowel and colon Providers:             Andrey Farmer MD, MD Referring MD:          Remus Blake MD, MD (Referring MD) Medicines:             Monitored Anesthesia Care Complications:         No immediate complications. Estimated blood loss:                         Minimal. Procedure:             Pre-Anesthesia Assessment:                        - Prior to the procedure, a History and Physical was                         performed, and patient medications and allergies were                         reviewed. The patient is competent. The risks and                         benefits of the procedure and the sedation options and                         risks were discussed with the patient. All questions                         were answered and informed consent was obtained.                         Patient identification and proposed procedure were                         verified by the physician, the nurse, the                         anesthesiologist, the anesthetist and the technician                         in the endoscopy suite. Mental Status Examination:                         alert and oriented. Airway Examination: normal                         oropharyngeal airway and neck mobility. Respiratory                         Examination: clear to auscultation. CV Examination:  normal. Prophylactic Antibiotics: The patient does not                         require prophylactic antibiotics. Prior                         Anticoagulants: The patient has taken no  anticoagulant                         or antiplatelet agents. ASA Grade Assessment: III - A                         patient with severe systemic disease. After reviewing                         the risks and benefits, the patient was deemed in                         satisfactory condition to undergo the procedure. The                         anesthesia plan was to use monitored anesthesia care                         (MAC). Immediately prior to administration of                         medications, the patient was re-assessed for adequacy                         to receive sedatives. The heart rate, respiratory                         rate, oxygen saturations, blood pressure, adequacy of                         pulmonary ventilation, and response to care were                         monitored throughout the procedure. The physical                         status of the patient was re-assessed after the                         procedure.                        After obtaining informed consent, the colonoscope was                         passed under direct vision. Throughout the procedure,                         the patient's blood pressure, pulse, and oxygen                         saturations were monitored continuously. The  Colonoscope was introduced through the anus and                         advanced to the the ileocolonic anastomosis. The                         colonoscopy was somewhat difficult due to a tortuous                         colon. The patient tolerated the procedure well. The                         quality of the bowel preparation was fair. Findings:      The perianal and digital rectal examinations were normal.      Patchy inflammation characterized by erythema and aphthous ulcerations       was found in the neo-terminal Ileum. The inflammation was moderate in       severity. Biopsies were taken with a cold forceps for histology.        Estimated blood loss was minimal.      A localized area of mildly erythematous mucosa was found in the sigmoid       colon. Biopsies were taken with a cold forceps for histology. If mucosa       is adenomatous, it was completely removed. It was very small. Estimated       blood loss was minimal.      A 1 mm polyp was found in the rectum. The polyp was sessile. The polyp       was removed with a jumbo cold forceps. Resection and retrieval were       complete. Estimated blood loss was minimal.      A 3 mm polyp was found in the rectum. The polyp was sessile. The polyp       was removed with a cold snare. Resection and retrieval were complete.       The mucosal defect bled more than expected but stopped without       intervention. There where other rectal polyps but they appeared       hyperplastic. If this one is adenomatous, would consider flex sig for       removal of other polyps. Estimated blood loss was minimal.      Internal hemorrhoids were found during retroflexion. The hemorrhoids       were Grade I (internal hemorrhoids that do not prolapse).      The exam was otherwise without abnormality on direct and retroflexion       views. Impression:            - Preparation of the colon was fair.                        - Crohn's disease with ileitis. Inflammation was                         found. This was moderate in severity. Biopsied.                        - Erythematous mucosa in the sigmoid colon. Biopsied.                        -  One 1 mm polyp in the rectum, removed with a jumbo                         cold forceps. Resected and retrieved.                        - One 3 mm polyp in the rectum, removed with a cold                         snare. Resected and retrieved.                        - Internal hemorrhoids.                        - The examination was otherwise normal on direct and                         retroflexion views. Recommendation:        - Discharge patient to  home.                        - Resume previous diet.                        - Continue present medications.                        - Await pathology results.                        - Repeat colonoscopy for surveillance based on                         pathology results.                        - Return to referring physician as previously                         scheduled. Procedure Code(s):     --- Professional ---                        [phone], Colonoscopy, flexible; with removal of                         tumor(s), polyp(s), or other lesion(s) by snare                         technique                        45380, 54, Colonoscopy, flexible; with biopsy, single                         or multiple Diagnosis Code(s):     --- Professional ---                        K64.0, First degree hemorrhoids  K50.00, Crohn's disease of small intestine without                         complications                        K63.89, Other specified diseases of intestine                        D12.8, Benign neoplasm of rectum                        K50.80, Crohn's disease of both small and large                         intestine without complications CPT copyright 2022 [employer]. All rights reserved. The codes documented in this report are preliminary and upon coder review may  be revised to meet current compliance requirements. Andrey Farmer MD, MD 11/25/2022 10:05:22 AM Number of Addenda: 0 Note Initiated On: 11/25/2022 8:51 AM Scope Withdrawal Time: 0 hours 14 minutes 19 seconds  Total Procedure Duration: 0 hours 22 minutes 1 second  Estimated Blood Loss:  Estimated blood loss was minimal.      [facility]

## 2022-11-25 NOTE — Interval H&P Note (Signed)
History and Physical Interval Note:  11/25/2022 9:01 AM  Rebecca Escobar  has presented today for surgery, with the diagnosis of Crohn's Disease Dysphagia.  The various methods of treatment have been discussed with the patient and family. After consideration of risks, benefits and other options for treatment, the patient has consented to  Procedure(s): COLONOSCOPY WITH PROPOFOL (N/A) ESOPHAGOGASTRODUODENOSCOPY (EGD) WITH PROPOFOL (N/A) as a surgical intervention.  The patient's history has been reviewed, patient examined, no change in status, stable for surgery.  I have reviewed the patient's chart and labs.  Questions were answered to the patient's satisfaction.     Lesly Rubenstein  Ok to proceed with EGD/Colonoscopy

## 2022-11-25 NOTE — Anesthesia Postprocedure Evaluation (Signed)
Anesthesia Post Note  Patient: Rebecca Escobar  Procedure(s) Performed: COLONOSCOPY WITH PROPOFOL ESOPHAGOGASTRODUODENOSCOPY (EGD) WITH PROPOFOL  Patient location during evaluation: PACU Anesthesia Type: General Level of consciousness: awake and alert, oriented and patient cooperative Pain management: pain level controlled Vital Signs Assessment: post-procedure vital signs reviewed and stable Respiratory status: spontaneous breathing, nonlabored ventilation and respiratory function stable Cardiovascular status: blood pressure returned to baseline and stable Postop Assessment: adequate PO intake Anesthetic complications: no   No notable events documented.   Last Vitals:  Vitals:   11/25/22 0945 11/25/22 1010  BP:    Pulse: 70   Resp: (!) 22   Temp:  (!) (P) 36 C  SpO2: 98%     Last Pain:  Vitals:   11/25/22 1010  TempSrc: (P) Temporal  PainSc: (P) 0-No pain                 Darrin Nipper

## 2022-11-28 ENCOUNTER — Encounter: Payer: Self-pay | Admitting: Gastroenterology

## 2022-11-28 LAB — SURGICAL PATHOLOGY

## 2023-04-25 ENCOUNTER — Ambulatory Visit: Payer: Medicare Other | Admitting: Neurosurgery

## 2023-04-27 NOTE — Progress Notes (Deleted)
Referring Physician:  Hyman Hopes, MD [address] [place],  [place] [address]  Primary Physician:  Hyman Hopes, MD  History of Present Illness: 04/27/2023*** [name] has a history of crohn's, hyperlipidemia, HTN.   She was seen in ED back on 04/25/22 for a T8 compression fracture that started after lifting a TV on 04/22/22. ED spoke with Dr. Myer Haff and he recommended she follow up as an outpatient. She was lost to follow up.    She smokes ***  Duration: *** Location: *** Quality: *** Severity: ***  Precipitating: aggravated by *** Modifying factors: made better by *** Weakness: none Timing: *** Bowel/Bladder Dysfunction: none  Conservative measures:  Physical therapy: ***  Multimodal medical therapy including regular antiinflammatories: ***  Injections: *** epidural steroid injections  Past Surgery: ***  Rebecca Escobar has ***no symptoms of cervical myelopathy.  The symptoms are causing a significant impact on the patient's life.   Review of Systems:  A 10 point review of systems is negative, except for the pertinent positives and negatives detailed in the HPI.  Past Medical History: Past Medical History:  Diagnosis Date   Anemia    Anxiety    Cervical cancer (HCC)    COPD (chronic obstructive pulmonary disease) (HCC)    Pt states she has "some" copd   Crohn's disease (HCC)    GERD (gastroesophageal reflux disease)    Squamous cell carcinoma of skin 06/10/2021   right proximal pretibial, treated with EDC   Tuberculosis    treated    Past Surgical History: Past Surgical History:  Procedure Laterality Date   ABDOMINAL HYSTERECTOMY     CESAREAN SECTION     COLON SURGERY     COLONOSCOPY WITH PROPOFOL N/A 03/02/2016   Procedure: COLONOSCOPY WITH PROPOFOL;  Surgeon: Scot Jun, MD;  Location: [facility] ENDOSCOPY;  Service: Endoscopy;  Laterality: N/A;   COLONOSCOPY WITH PROPOFOL N/A 11/20/2019   Procedure: COLONOSCOPY WITH  PROPOFOL;  Surgeon: Toledo, Boykin Nearing, MD;  Location: [facility] ENDOSCOPY;  Service: Gastroenterology;  Laterality: N/A;   COLONOSCOPY WITH PROPOFOL N/A 07/09/2021   Procedure: COLONOSCOPY WITH PROPOFOL;  Surgeon: Regis Bill, MD;  Location: [facility] ENDOSCOPY;  Service: Endoscopy;  Laterality: N/A;   COLONOSCOPY WITH PROPOFOL N/A 11/25/2022   Procedure: COLONOSCOPY WITH PROPOFOL;  Surgeon: Regis Bill, MD;  Location: [facility] ENDOSCOPY;  Service: Endoscopy;  Laterality: N/A;   DILATION AND CURETTAGE OF UTERUS     ESOPHAGOGASTRODUODENOSCOPY (EGD) WITH PROPOFOL N/A 11/20/2019   Procedure: ESOPHAGOGASTRODUODENOSCOPY (EGD) WITH PROPOFOL;  Surgeon: Toledo, Boykin Nearing, MD;  Location: [facility] ENDOSCOPY;  Service: Gastroenterology;  Laterality: N/A;   ESOPHAGOGASTRODUODENOSCOPY (EGD) WITH PROPOFOL N/A 11/25/2022   Procedure: ESOPHAGOGASTRODUODENOSCOPY (EGD) WITH PROPOFOL;  Surgeon: Regis Bill, MD;  Location: [facility] ENDOSCOPY;  Service: Endoscopy;  Laterality: N/A;    Allergies: Allergies as of 04/28/2023 - Review Complete 11/25/2022  Allergen Reaction Noted   Nsaids  03/01/2016    Medications: Outpatient Encounter Medications as of 04/28/2023  Medication Sig   albuterol (PROVENTIL HFA;VENTOLIN HFA) 108 (90 Base) MCG/ACT inhaler Inhale 2 puffs into the lungs every 6 (six) hours as needed for wheezing or shortness of breath.   atorvastatin (LIPITOR) 10 MG tablet Take 10 mg by mouth daily.   calcium-vitamin D (OSCAL WITH D) 500-5 MG-MCG tablet Take 2 tablets by mouth daily.   Certolizumab Pegol 2 X 200 MG/ML KIT Inject 400 mg into the skin every 30 (thirty) days.   CIMZIA PREFILLED 2  X 200 MG/ML PSKT Inject into the skin.   FLOVENT HFA 110 MCG/ACT inhaler Inhale 2 puffs into the lungs 2 (two) times daily.   fluticasone-salmeterol (ADVAIR) 250-50 MCG/ACT AEPB Inhale 1 puff into the lungs in the morning and at bedtime.   Multiple Vitamin (MULTI-VITAMINS) TABS Take by mouth.   omeprazole (PRILOSEC)  20 MG capsule Take 20 mg by mouth daily.   valACYclovir (VALTREX) 1000 MG tablet Take 1,000 mg by mouth daily.   Vitamin D, Ergocalciferol, (DRISDOL) 1.25 MG (50000 UT) CAPS capsule Take by mouth.   No facility-administered encounter medications on file as of 04/28/2023.    Social History: Social History   Tobacco Use   Smoking status: Every Day    Packs/day: 0.75    Years: 40.00    Additional pack years: 0.00    Total pack years: 30.00    Types: Cigarettes   Smokeless tobacco: Never  Vaping Use   Vaping Use: Never used  Substance Use Topics   Alcohol use: No   Drug use: No    Family Medical History: Family History  Problem Relation Age of Onset   Kidney disease Neg Hx    Bladder Cancer Neg Hx    Breast cancer Neg Hx     Physical Examination: There were no vitals filed for this visit.  General: Patient is well developed, well nourished, calm, collected, and in no apparent distress. Attention to examination is appropriate.  Respiratory: Patient is breathing without any difficulty.   NEUROLOGICAL:     Awake, alert, oriented to person, place, and time.  Speech is clear and fluent. Fund of knowledge is appropriate.   Cranial Nerves: Pupils equal round and reactive to light.  Facial tone is symmetric.    *** ROM of cervical spine *** pain *** posterior cervical tenderness. *** tenderness in bilateral trapezial region.   *** ROM of lumbar spine *** pain *** posterior lumbar tenderness.   No abnormal lesions on exposed skin.   Strength: Side Biceps Triceps Deltoid Interossei Grip Wrist Ext. Wrist Flex.  R 5 5 5 5 5 5 5   L 5 5 5 5 5 5 5    Side Iliopsoas Quads Hamstring PF DF EHL  R 5 5 5 5 5 5   L 5 5 5 5 5 5    Reflexes are ***2+ and symmetric at the biceps, triceps, brachioradialis, patella and achilles.   Hoffman's is absent.  Clonus is not present.   Bilateral upper and lower extremity sensation is intact to light touch.     Gait is normal.   ***No  difficulty with tandem gait.    Medical Decision Making  Imaging: CT of thoracic and lumbar spine dated 04/25/22:  FINDINGS: CT THORACIC SPINE FINDINGS   Alignment: Normal.   Vertebrae: There is new mild loss of vertebral body height at T8 associated with acute fracture involving the superior endplate. There is approximately 20% loss of vertebral body height. No osseous retropulsion. Scattered endplate degenerative changes elsewhere in the thoracic spine are stable, and no other acute osseous findings are evident.   Paraspinal and other soft tissues: Anterior paraspinal edema at T8 corresponding with the acute fracture. No focal hematoma. Moderate emphysematous changes in both lungs. Aortic atherosclerosis.   Disc levels: Multilevel endplate degenerative changes with vacuum disc phenomenon, endplate osteophytes and scattered small disc protrusions. The disc protrusions are most prominent paracentrally on the right at T5-6 and T6-7 and centrally at T7-8. No large disc herniation or high-grade spinal stenosis  identified.   CT LUMBAR SPINE FINDINGS   Segmentation: There are 5 lumbar type vertebral bodies.   Alignment: Normal.   Vertebrae: No evidence of acute fracture, traumatic subluxation or pars defect. Mild sacroiliac degenerative changes bilaterally. No evidence of sacroiliitis.   Paraspinal and other soft tissues: No acute paraspinal findings identified in the lumbar region. There is aortic and branch vessel atherosclerosis. Previous ileocolonic anastomosis noted.   Disc levels: Mild chronic spondylosis. At L1-2, there is loss of disc height with annular disc bulging and endplate osteophytes. No significant disc space findings at L2-3.   L3-4: Loss of disc height with broad-based left foraminal disc protrusion contributing to chronic left foraminal narrowing.   L4-5: Mild disc bulging, facet and ligamentous hypertrophy. No significant spinal stenosis.   L5-S1:  Chronic degenerative disc disease with loss of disc height, vacuum phenomenon and endplate osteophytes. Mild chronic foraminal narrowing bilaterally.   IMPRESSION: 1. Acute mild superior endplate compression fracture at T8 without associated osseous retropulsion. 2. No other acute osseous findings in the thoracolumbar spine. The alignment is normal. 3. Mild chronic thoracolumbar spondylosis, similar to previous CTs of the chest, abdomen and pelvis. No large disc herniation or high-grade spinal stenosis identified.     Electronically Signed   By: Carey Bullocks M.D.   On: 04/25/2022 13:39  I have personally reviewed the images and agree with the above interpretation.  Assessment and Plan: Ms. Carucci is a pleasant 62 y.o. female has ***  Treatment options discussed with patient and following plan made:   - Order for physical therapy for *** spine ***. Patient to call to schedule appointment. *** - Continue current medications including ***. Reviewed dosing and side effects.  - Prescription for ***. Reviewed dosing and side effects. Take with food.  - Prescription for *** to take prn muscle spasms. Reviewed dosing and side effects. Discussed this can cause drowsiness.  - MRI of *** to further evaluate *** radiculopathy. No improvement time or medications (***).  - Referral to [facility] at [facility] to discuss possible *** injections.  - Will schedule phone visit to review MRI results once I get them back.   I spent a total of *** minutes in face-to-face and non-face-to-face activities related to this patient's care today including review of outside records, review of imaging, review of symptoms, physical exam, discussion of differential diagnosis, discussion of treatment options, and documentation.   Thank you for involving me in the care of this patient.   Drake Leach PA-C Dept. of Neurosurgery

## 2023-04-28 ENCOUNTER — Ambulatory Visit: Payer: Medicare Other | Admitting: Orthopedic Surgery

## 2023-05-22 ENCOUNTER — Ambulatory Visit (LOCAL_COMMUNITY_HEALTH_CENTER): Payer: Medicare Other

## 2023-05-22 DIAGNOSIS — Z111 Encounter for screening for respiratory tuberculosis: Secondary | ICD-10-CM

## 2023-05-22 NOTE — Progress Notes (Signed)
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  Stated she just wanted to get one for her own knowledge.  She reports she has had a cough for a long time, and is trying to rule out all the causes of the cough.  She reported she thought she had a TB Skin test about 30 years ago, and that it was maybe positive, but she wasn't really sure, and she didn't remember where she had the test placed.    PPD place  right forearm.  Kahlil Cowans Sherrilyn Rist, RN

## 2023-05-23 ENCOUNTER — Other Ambulatory Visit: Payer: Self-pay | Admitting: Family Medicine

## 2023-05-23 DIAGNOSIS — Z1231 Encounter for screening mammogram for malignant neoplasm of breast: Secondary | ICD-10-CM

## 2023-05-25 ENCOUNTER — Ambulatory Visit (LOCAL_COMMUNITY_HEALTH_CENTER): Payer: Self-pay

## 2023-05-25 DIAGNOSIS — Z111 Encounter for screening for respiratory tuberculosis: Secondary | ICD-10-CM

## 2023-05-25 LAB — TB SKIN TEST
Induration: 0 mm
TB Skin Test: NEGATIVE

## 2023-06-01 ENCOUNTER — Ambulatory Visit
Admission: RE | Admit: 2023-06-01 | Discharge: 2023-06-01 | Disposition: A | Discharge status: Home / Self Care | Payer: Medicare Other | Source: Ambulatory Visit | Attending: Internal Medicine | Admitting: Internal Medicine

## 2023-06-01 DIAGNOSIS — Z122 Encounter for screening for malignant neoplasm of respiratory organs: Secondary | ICD-10-CM | POA: Insufficient documentation

## 2023-06-01 DIAGNOSIS — J439 Emphysema, unspecified: Secondary | ICD-10-CM | POA: Diagnosis not present

## 2023-06-01 DIAGNOSIS — I700 Atherosclerosis of aorta: Secondary | ICD-10-CM | POA: Diagnosis not present

## 2023-06-01 DIAGNOSIS — F17210 Nicotine dependence, cigarettes, uncomplicated: Secondary | ICD-10-CM | POA: Insufficient documentation

## 2023-06-01 DIAGNOSIS — M5134 Other intervertebral disc degeneration, thoracic region: Secondary | ICD-10-CM | POA: Diagnosis not present

## 2023-06-01 DIAGNOSIS — I2510 Atherosclerotic heart disease of native coronary artery without angina pectoris: Secondary | ICD-10-CM | POA: Diagnosis not present

## 2023-06-01 DIAGNOSIS — Z87891 Personal history of nicotine dependence: Secondary | ICD-10-CM

## 2023-06-05 ENCOUNTER — Other Ambulatory Visit: Payer: Self-pay | Admitting: Acute Care

## 2023-06-05 DIAGNOSIS — F17210 Nicotine dependence, cigarettes, uncomplicated: Secondary | ICD-10-CM

## 2023-06-05 DIAGNOSIS — Z122 Encounter for screening for malignant neoplasm of respiratory organs: Secondary | ICD-10-CM

## 2023-06-05 DIAGNOSIS — Z87891 Personal history of nicotine dependence: Secondary | ICD-10-CM

## 2023-06-29 ENCOUNTER — Ambulatory Visit
Admission: RE | Admit: 2023-06-29 | Discharge: 2023-06-29 | Disposition: A | Discharge status: Home / Self Care | Payer: Medicare Other | Source: Ambulatory Visit | Attending: Family Medicine | Admitting: Family Medicine

## 2023-06-29 DIAGNOSIS — Z1231 Encounter for screening mammogram for malignant neoplasm of breast: Secondary | ICD-10-CM | POA: Insufficient documentation

## 2023-08-16 ENCOUNTER — Encounter: Payer: Self-pay | Admitting: Dermatology

## 2023-08-16 ENCOUNTER — Ambulatory Visit: Payer: Medicare Other | Admitting: Dermatology

## 2023-08-16 VITALS — BP 134/84 | HR 79

## 2023-08-16 DIAGNOSIS — L578 Other skin changes due to chronic exposure to nonionizing radiation: Secondary | ICD-10-CM

## 2023-08-16 DIAGNOSIS — D692 Other nonthrombocytopenic purpura: Secondary | ICD-10-CM | POA: Diagnosis not present

## 2023-08-16 DIAGNOSIS — Z85828 Personal history of other malignant neoplasm of skin: Secondary | ICD-10-CM

## 2023-08-16 DIAGNOSIS — L820 Inflamed seborrheic keratosis: Secondary | ICD-10-CM | POA: Diagnosis not present

## 2023-08-16 DIAGNOSIS — L814 Other melanin hyperpigmentation: Secondary | ICD-10-CM | POA: Diagnosis not present

## 2023-08-16 DIAGNOSIS — Z8589 Personal history of malignant neoplasm of other organs and systems: Secondary | ICD-10-CM

## 2023-08-16 NOTE — Progress Notes (Signed)
Follow-Up Visit   Subjective  Rebecca Escobar is a 62 y.o. female who presents for the following: Spots on back. Concerned because areas are dark in color. Irritated at times.  Hx of SCC at right lower leg. Tx in 2022.  The patient has spots, moles and lesions to be evaluated, some may be new or changing and the patient may have concern these could be cancer.    The following portions of the chart were reviewed this encounter and updated as appropriate: medications, allergies, medical history  Review of Systems:  No other skin or systemic complaints except as noted in HPI or Assessment and Plan.  Objective  Well appearing patient in no apparent distress; mood and affect are within normal limits.  A focused examination was performed of the following areas: back Relevant physical exam findings are noted in the Assessment and Plan.  Left scapula, R wing of butterfly tattoo x1, L mid back x1, R scapula x1 (3) Erythematous keratotic or waxy stuck-on papule or plaque.    Assessment & Plan   Inflamed seborrheic keratosis (3) Left scapula, R wing of butterfly tattoo x1, L mid back x1, R scapula x1  Symptomatic, irritating, patient would like treated.  Destruction of lesion - Left scapula, R wing of butterfly tattoo x1, L mid back x1, R scapula x1 (3) Complexity: simple   Destruction method: cryotherapy   Informed consent: discussed and consent obtained   Timeout:  patient name, date of birth, surgical site, and procedure verified Lesion destroyed using liquid nitrogen: Yes   Region frozen until ice ball extended beyond lesion: Yes   Outcome: patient tolerated procedure well with no complications   Post-procedure details: wound care instructions given   Additional details:  Prior to procedure, discussed risks of blister formation, small wound, skin dyspigmentation, or rare scar following cryotherapy. Recommend Vaseline ointment to treated areas while healing.   HISTORY OF  SQUAMOUS CELL CARCINOMA OF THE SKIN. Right proximal pretibial. EDC 06/10/2021. - No evidence of recurrence today - No lymphadenopathy - Recommend regular full body skin exams - Recommend daily broad spectrum sunscreen SPF 30+ to sun-exposed areas, reapply every 2 hours as needed.  - Call if any new or changing lesions are noted between office visits   Purpura - Chronic; persistent and recurrent.  Treatable, but not curable. - Violaceous macules and patches - Benign - Related to trauma, age, sun damage and/or use of blood thinners, chronic use of topical and/or oral steroids - Observe - Can use OTC arnica containing moisturizer such as Dermend Bruise Formula if desired - Call for worsening or other concerns  LENTIGINES Exam: scattered tan macules Due to sun exposure Treatment Plan: Benign-appearing, observe. Recommend daily broad spectrum sunscreen SPF 30+ to sun-exposed areas, reapply every 2 hours as needed.  Call for any changes  ACTINIC DAMAGE - chronic, secondary to cumulative UV radiation exposure/sun exposure over time - diffuse scaly erythematous macules with underlying dyspigmentation - Recommend daily broad spectrum sunscreen SPF 30+ to sun-exposed areas, reapply every 2 hours as needed.  - Recommend staying in the shade or wearing long sleeves, sun glasses (UVA+UVB protection) and wide brim hats (4-inch brim around the entire circumference of the hat). - Call for new or changing lesions.    Return for TBSE, HxSCC in 1-2 years.  I, Lawson Radar, CMA, am acting as scribe for Armida Sans, MD.   Documentation: I have reviewed the above documentation for accuracy and completeness, and I agree with the  above.  Armida Sans, MD

## 2023-08-16 NOTE — Patient Instructions (Signed)

## 2023-10-06 ENCOUNTER — Encounter: Payer: Self-pay | Admitting: *Deleted

## 2023-10-11 ENCOUNTER — Other Ambulatory Visit: Payer: Self-pay | Admitting: Gastroenterology

## 2023-10-11 DIAGNOSIS — K5000 Crohn's disease of small intestine without complications: Secondary | ICD-10-CM

## 2023-10-13 ENCOUNTER — Ambulatory Visit: Payer: Medicare Other

## 2023-10-13 ENCOUNTER — Ambulatory Visit
Admission: RE | Admit: 2023-10-13 | Discharge: 2023-10-13 | Disposition: A | Discharge status: Home / Self Care | Payer: Medicare Other | Attending: Gastroenterology | Admitting: Gastroenterology

## 2023-10-13 ENCOUNTER — Encounter: Payer: Self-pay | Admitting: *Deleted

## 2023-10-13 ENCOUNTER — Encounter
Admission: RE | Disposition: A | Discharge status: Home / Self Care | Payer: Self-pay | Source: Home / Self Care | Attending: Gastroenterology

## 2023-10-13 ENCOUNTER — Ambulatory Visit: Admission: RE | Admit: 2023-10-13 | Payer: Medicare Other | Source: Ambulatory Visit

## 2023-10-13 DIAGNOSIS — Q438 Other specified congenital malformations of intestine: Secondary | ICD-10-CM | POA: Insufficient documentation

## 2023-10-13 DIAGNOSIS — K633 Ulcer of intestine: Secondary | ICD-10-CM | POA: Diagnosis not present

## 2023-10-13 DIAGNOSIS — K640 First degree hemorrhoids: Secondary | ICD-10-CM | POA: Diagnosis not present

## 2023-10-13 DIAGNOSIS — K219 Gastro-esophageal reflux disease without esophagitis: Secondary | ICD-10-CM | POA: Diagnosis not present

## 2023-10-13 DIAGNOSIS — Z980 Intestinal bypass and anastomosis status: Secondary | ICD-10-CM | POA: Diagnosis not present

## 2023-10-13 DIAGNOSIS — Z79620 Long term (current) use of immunosuppressive biologic: Secondary | ICD-10-CM | POA: Diagnosis not present

## 2023-10-13 DIAGNOSIS — K5000 Crohn's disease of small intestine without complications: Secondary | ICD-10-CM | POA: Insufficient documentation

## 2023-10-13 DIAGNOSIS — J449 Chronic obstructive pulmonary disease, unspecified: Secondary | ICD-10-CM | POA: Insufficient documentation

## 2023-10-13 DIAGNOSIS — F17200 Nicotine dependence, unspecified, uncomplicated: Secondary | ICD-10-CM | POA: Diagnosis not present

## 2023-10-13 HISTORY — PX: COLONOSCOPY WITH PROPOFOL: SHX5780

## 2023-10-13 SURGERY — COLONOSCOPY WITH PROPOFOL
Anesthesia: General

## 2023-10-13 MED ORDER — LIDOCAINE HCL (PF) 2 % IJ SOLN
INTRAMUSCULAR | Status: AC
  Filled 2023-10-13: qty 5

## 2023-10-13 MED ORDER — SODIUM CHLORIDE 0.9 % IV SOLN: INTRAVENOUS | Status: DC

## 2023-10-13 MED ORDER — PROPOFOL 500 MG/50ML IV EMUL
INTRAVENOUS | Status: DC | PRN
  Administered 2023-10-13: 150 ug/kg/min via INTRAVENOUS
  Administered 2023-10-13: 80 mg via INTRAVENOUS

## 2023-10-13 MED ORDER — PROPOFOL 1000 MG/100ML IV EMUL
INTRAVENOUS | Status: AC
  Filled 2023-10-13: qty 100

## 2023-10-13 NOTE — Interval H&P Note (Signed)
History and Physical Interval Note:  10/13/2023 12:33 PM  Rebecca Escobar  has presented today for surgery, with the diagnosis of CROHN'S DISEASE.  The various methods of treatment have been discussed with the patient and family. After consideration of risks, benefits and other options for treatment, the patient has consented to  Procedure(s): COLONOSCOPY WITH PROPOFOL (N/A) as a surgical intervention.  The patient's history has been reviewed, patient examined, no change in status, stable for surgery.  I have reviewed the patient's chart and labs.  Questions were answered to the patient's satisfaction.     Regis Bill  Ok to proceed with colonoscopy

## 2023-10-13 NOTE — Anesthesia Preprocedure Evaluation (Signed)
Anesthesia Evaluation  Patient identified by MRN, date of birth, ID band Patient awake    Reviewed: Allergy & Precautions, NPO status , Patient's Chart, lab work & pertinent test results  Airway Mallampati: II  TM Distance: >3 FB Neck ROM: Full    Dental  (+) Edentulous Upper, Partial Lower   Pulmonary neg pulmonary ROS, COPD, Current Smoker and Patient abstained from smoking.   Pulmonary exam normal  + decreased breath sounds      Cardiovascular Exercise Tolerance: Good negative cardio ROS Normal cardiovascular exam Rhythm:Regular Rate:Normal     Neuro/Psych   Anxiety     negative neurological ROS  negative psych ROS   GI/Hepatic negative GI ROS, Neg liver ROS,GERD  Medicated,,  Endo/Other  negative endocrine ROS    Renal/GU negative Renal ROS  negative genitourinary   Musculoskeletal   Abdominal Normal abdominal exam  (+)   Peds negative pediatric ROS (+)  Hematology negative hematology ROS (+) Blood dyscrasia, anemia   Anesthesia Other Findings Past Medical History: No date: Anemia No date: Anxiety No date: Cervical cancer (HCC) No date: COPD (chronic obstructive pulmonary disease) (HCC)     Comment:  Pt states she has "some" copd No date: Crohn's disease (HCC) No date: GERD (gastroesophageal reflux disease) 06/10/2021: Squamous cell carcinoma of skin     Comment:  right proximal pretibial, treated with EDC No date: Tuberculosis     Comment:  treated  Past Surgical History: No date: ABDOMINAL HYSTERECTOMY No date: CESAREAN SECTION No date: COLON SURGERY 03/02/2016: COLONOSCOPY WITH PROPOFOL; N/A     Comment:  Procedure: COLONOSCOPY WITH PROPOFOL;  Surgeon: Scot Jun, MD;  Location: [facility] ENDOSCOPY;  Service:               Endoscopy;  Laterality: N/A; 11/20/2019: COLONOSCOPY WITH PROPOFOL; N/A     Comment:  Procedure: COLONOSCOPY WITH PROPOFOL;  Surgeon: Toledo,                Boykin Nearing, MD;  Location: [facility] ENDOSCOPY;  Service:               Gastroenterology;  Laterality: N/A; 07/09/2021: COLONOSCOPY WITH PROPOFOL; N/A     Comment:  Procedure: COLONOSCOPY WITH PROPOFOL;  Surgeon:               Regis Bill, MD;  Location: [facility] ENDOSCOPY;                Service: Endoscopy;  Laterality: N/A; 11/25/2022: COLONOSCOPY WITH PROPOFOL; N/A     Comment:  Procedure: COLONOSCOPY WITH PROPOFOL;  Surgeon:               Regis Bill, MD;  Location: [facility] ENDOSCOPY;                Service: Endoscopy;  Laterality: N/A; No date: DILATION AND CURETTAGE OF UTERUS 11/20/2019: ESOPHAGOGASTRODUODENOSCOPY (EGD) WITH PROPOFOL; N/A     Comment:  Procedure: ESOPHAGOGASTRODUODENOSCOPY (EGD) WITH               PROPOFOL;  Surgeon: Toledo, Boykin Nearing, MD;  Location:               [facility] ENDOSCOPY;  Service: Gastroenterology;  Laterality:               N/A; 11/25/2022: ESOPHAGOGASTRODUODENOSCOPY (EGD) WITH PROPOFOL; N/A     Comment:  Procedure: ESOPHAGOGASTRODUODENOSCOPY (EGD) WITH  PROPOFOL;  Surgeon: Regis Bill, MD;  Location:               [facility] ENDOSCOPY;  Service: Endoscopy;  Laterality: N/A;  BMI    Body Mass Index: 24.44 kg/m      Reproductive/Obstetrics negative OB ROS                             Anesthesia Physical Anesthesia Plan  ASA: 3  Anesthesia Plan: General   Post-op Pain Management:    Induction: Intravenous  PONV Risk Score and Plan: Propofol infusion and TIVA  Airway Management Planned: Natural Airway and Nasal Cannula  Additional Equipment:   Intra-op Plan:   Post-operative Plan:   Informed Consent: I have reviewed the patients History and Physical, chart, labs and discussed the procedure including the risks, benefits and alternatives for the proposed anesthesia with the patient or authorized representative who has indicated his/her understanding and acceptance.     Dental Advisory Given  Plan  Discussed with: Anesthesiologist, CRNA and Surgeon  Anesthesia Plan Comments:        Anesthesia Quick Evaluation

## 2023-10-13 NOTE — H&P (Signed)
Outpatient short stay form Pre-procedure 10/13/2023  Regis Bill, MD  Primary Physician: Resa Miner, MD  Reason for visit:  Crohns disease  History of present illness:    62 y/o lady with history of small and large bowel crohns disease s/p two surgeries who is currently on cimzia injections. Last colonoscopy last year with active disease but she was missing a lot of her injections. No blood thinners. No family history of GI malignancies.    Current Facility-Administered Medications:    0.9 %  sodium chloride infusion, , Intravenous, Continuous, Elexa Kivi, Rossie Muskrat, MD, Last Rate: 20 mL/hr at 10/13/23 1140, New Bag at 10/13/23 1140  Medications Prior to Admission  Medication Sig Dispense Refill Last Dose   albuterol (PROVENTIL HFA;VENTOLIN HFA) 108 (90 Base) MCG/ACT inhaler Inhale 2 puffs into the lungs every 6 (six) hours as needed for wheezing or shortness of breath. 1 Inhaler 0    atorvastatin (LIPITOR) 10 MG tablet Take 10 mg by mouth daily.      calcium-vitamin D (OSCAL WITH D) 500-5 MG-MCG tablet Take 2 tablets by mouth daily. 180 tablet 3    Certolizumab Pegol 2 X 200 MG/ML KIT Inject 400 mg into the skin every 30 (thirty) days.      CIMZIA PREFILLED 2 X 200 MG/ML PSKT Inject into the skin.      FLOVENT HFA 110 MCG/ACT inhaler Inhale 2 puffs into the lungs 2 (two) times daily.      fluticasone-salmeterol (ADVAIR) 250-50 MCG/ACT AEPB Inhale 1 puff into the lungs in the morning and at bedtime.      Multiple Vitamin (MULTI-VITAMINS) TABS Take by mouth.      omeprazole (PRILOSEC) 20 MG capsule Take 20 mg by mouth daily.      valACYclovir (VALTREX) 1000 MG tablet Take 1,000 mg by mouth daily.      Vitamin D, Ergocalciferol, (DRISDOL) 1.25 MG (50000 UT) CAPS capsule Take by mouth.        Allergies  Allergen Reactions   Nsaids     Due to Crohns disease     Past Medical History:  Diagnosis Date   Anemia    Anxiety    Cervical cancer (HCC)    COPD (chronic  obstructive pulmonary disease) (HCC)    Pt states she has "some" copd   Crohn's disease (HCC)    GERD (gastroesophageal reflux disease)    Squamous cell carcinoma of skin 06/10/2021   right proximal pretibial, treated with EDC   Tuberculosis    treated    Review of systems:  Otherwise negative.    Physical Exam  Gen: Alert, oriented. Appears stated age.  HEENT: PERRLA. Lungs: No respiratory distress CV: RRR Abd: soft, benign, no masses Ext: No edema    Planned procedures: Proceed with colonoscopy. The patient understands the nature of the planned procedure, indications, risks, alternatives and potential complications including but not limited to bleeding, infection, perforation, damage to internal organs and possible oversedation/side effects from anesthesia. The patient agrees and gives consent to proceed.  Please refer to procedure notes for findings, recommendations and patient disposition/instructions.     Regis Bill, MD [facility] Gastroenterology

## 2023-10-13 NOTE — Op Note (Signed)
[facility] Patient Name: Rebecca Escobar Procedure Date: 10/13/2023 1:25 PM MRN: [id number] Account #: [id number] Date of Birth: November 12, 1961 Admit Type: Outpatient Age: 62 Room: [facility] ENDO ROOM 3 Gender: Female Note Status: Finalized Instrument Name: Nelda Marseille [id number] Procedure:             Colonoscopy Indications:           Crohn's disease of the small bowel and colon Providers:             Eather Colas MD, MD Referring MD:          No Local Md, MD (Referring MD) Medicines:             Monitored Anesthesia Care Complications:         No immediate complications. Estimated blood loss:                         Minimal. Procedure:             Pre-Anesthesia Assessment:                        - Prior to the procedure, a History and Physical was                         performed, and patient medications and allergies were                         reviewed. The patient is competent. The risks and                         benefits of the procedure and the sedation options and                         risks were discussed with the patient. All questions                         were answered and informed consent was obtained.                         Patient identification and proposed procedure were                         verified by the physician, the nurse, the                         anesthesiologist, the anesthetist and the technician                         in the endoscopy suite. Mental Status Examination:                         alert and oriented. Airway Examination: normal                         oropharyngeal airway and neck mobility. Respiratory                         Examination: clear to auscultation. CV Examination:  normal. Prophylactic Antibiotics: The patient does not                         require prophylactic antibiotics. Prior                         Anticoagulants: The patient has taken no anticoagulant                          or antiplatelet agents. ASA Grade Assessment: III - A                         patient with severe systemic disease. After reviewing                         the risks and benefits, the patient was deemed in                         satisfactory condition to undergo the procedure. The                         anesthesia plan was to use monitored anesthesia care                         (MAC). Immediately prior to administration of                         medications, the patient was re-assessed for adequacy                         to receive sedatives. The heart rate, respiratory                         rate, oxygen saturations, blood pressure, adequacy of                         pulmonary ventilation, and response to care were                         monitored throughout the procedure. The physical                         status of the patient was re-assessed after the                         procedure.                        After obtaining informed consent, the colonoscope was                         passed under direct vision. Throughout the procedure,                         the patient's blood pressure, pulse, and oxygen                         saturations were monitored continuously. The  Colonoscope was introduced through the anus and                         advanced to the the ileocolonic anastomosis. The                         colonoscopy was somewhat difficult due to significant                         looping. Successful completion of the procedure was                         aided by applying abdominal pressure. The patient                         tolerated the procedure well. The quality of the bowel                         preparation was fair. The rectum was photographed. Findings:      The perianal and digital rectal examinations were normal.      There was evidence of an end-to-side ileo-anal anastomosis found in the       neo-terminal  Ileum. This was characterized by ulceration. The ulceration       was mostly at the anastomosis. When attempting to intubate the       neo-terminal ileum, the mucosa looked normal but could not adequately       intubate the neo-terminal ileum due to looping. This was not traversed.       Biopsies were taken with a cold forceps for histology. Estimated blood       loss was minimal.      Discontinuous areas of nonbleeding ulcerated mucosa with no stigmata of       recent bleeding were present in the sigmoid colon, in the descending       colon, in the transverse colon and in the ascending colon. This was very       mild in nature. Biopsies were taken with a cold forceps for histology.       Estimated blood loss was minimal.      Internal hemorrhoids were found during retroflexion. The hemorrhoids       were Grade I (internal hemorrhoids that do not prolapse).      The exam was otherwise without abnormality on direct and retroflexion       views. Impression:            - Preparation of the colon was fair.                        - End-to-side ileo-anal anastomosis, characterized by                         ulceration. Biopsied.                        - Mucosal ulceration in the sigmoid colon, in the                         descending colon, in the transverse colon and in the  ascending colon. Biopsied.                        - Internal hemorrhoids.                        - The examination was otherwise normal on direct and                         retroflexion views. Recommendation:        - Discharge patient to home.                        - Resume previous diet.                        - Continue present medications.                        - Await pathology results.                        - Perform a computed tomographic (CT scan)                         enterography as previously scheduled.                        - Return to referring physician as previously                          scheduled. Procedure Code(s):     --- Professional ---                        [id number], Colonoscopy, flexible; with biopsy, single or                         multiple Diagnosis Code(s):     --- Professional ---                        K64.0, First degree hemorrhoids                        Z98.0, Intestinal bypass and anastomosis status                        K63.3, Ulcer of intestine                        K50.80, Crohn's disease of both small and large                         intestine without complications CPT copyright 2022 American [employer] Association. All rights reserved. The codes documented in this report are preliminary and upon coder review may  be revised to meet current compliance requirements. Eather Colas MD, MD 10/13/2023 2:06:19 PM Number of Addenda: 0 Note Initiated On: 10/13/2023 1:25 PM Scope Withdrawal Time: 0 hours 9 minutes 15 seconds  Total Procedure Duration: 0 hours 23 minutes 40 seconds  Estimated Blood Loss:  Estimated blood loss was minimal.      [facility]

## 2023-10-13 NOTE — OR Nursing (Signed)
Extent reached at 1348.  Scope withdrawal started at 1348.

## 2023-10-13 NOTE — Transfer of Care (Signed)
Immediate Anesthesia Transfer of Care Note  Patient: Rebecca Escobar  Procedure(s) Performed: COLONOSCOPY WITH PROPOFOL  Patient Location: PACU  Anesthesia Type:General  Level of Consciousness: drowsy  Airway & Oxygen Therapy: Patient Spontanous Breathing  Post-op Assessment: Report given to RN and Post -op Vital signs reviewed and stable  Post vital signs: Reviewed and stable  Last Vitals:  Vitals Value Taken Time  BP 126/69 10/13/23 1401  Temp 36.4 C 10/13/23 1400  Pulse 72 10/13/23 1401  Resp 19 10/13/23 1401  SpO2 99 % 10/13/23 1401  Vitals shown include unfiled device data.  Last Pain:  Vitals:   10/13/23 1400  TempSrc: Temporal  PainSc: Asleep         Complications: There were no known notable events for this encounter.

## 2023-10-13 NOTE — Anesthesia Postprocedure Evaluation (Signed)
Anesthesia Post Note  Patient: Rebecca Escobar  Procedure(s) Performed: COLONOSCOPY WITH PROPOFOL  Patient location during evaluation: PACU Anesthesia Type: General Level of consciousness: awake Pain management: pain level controlled Vital Signs Assessment: post-procedure vital signs reviewed and stable Respiratory status: spontaneous breathing Cardiovascular status: stable Anesthetic complications: no   There were no known notable events for this encounter.   Last Vitals:  Vitals:   10/13/23 1400 10/13/23 1420  BP: 126/69 124/69  Resp: 20   Temp: (!) 36.4 C   SpO2:      Last Pain:  Vitals:   10/13/23 1420  TempSrc:   PainSc: 0-No pain                 VAN STAVEREN,Wynn Alldredge

## 2023-10-16 LAB — SURGICAL PATHOLOGY

## 2023-10-17 ENCOUNTER — Encounter: Payer: Self-pay | Admitting: Gastroenterology

## 2023-10-18 ENCOUNTER — Ambulatory Visit
Admission: RE | Admit: 2023-10-18 | Discharge: 2023-10-18 | Disposition: A | Discharge status: Home / Self Care | Payer: Medicare Other | Source: Ambulatory Visit | Attending: Gastroenterology | Admitting: Gastroenterology

## 2023-10-18 DIAGNOSIS — K5000 Crohn's disease of small intestine without complications: Secondary | ICD-10-CM | POA: Diagnosis present

## 2023-10-18 MED ORDER — IOHEXOL 300 MG/ML  SOLN
75.0000 mL | Freq: Once | INTRAMUSCULAR | Status: AC | PRN
  Administered 2023-10-18: 75 mL via INTRAVENOUS

## 2024-04-15 ENCOUNTER — Telehealth: Payer: Self-pay

## 2024-04-15 NOTE — Telephone Encounter (Signed)
 Returned VM. Advised annual CT has been schedule for 06/03/2024 a OPIC at 2:00pm.

## 2024-06-03 ENCOUNTER — Ambulatory Visit
Admission: RE | Admit: 2024-06-03 | Discharge: 2024-06-03 | Disposition: A | Discharge status: Home / Self Care | Source: Ambulatory Visit | Attending: Acute Care | Admitting: Acute Care

## 2024-06-03 DIAGNOSIS — F17210 Nicotine dependence, cigarettes, uncomplicated: Secondary | ICD-10-CM | POA: Diagnosis present

## 2024-06-03 DIAGNOSIS — Z122 Encounter for screening for malignant neoplasm of respiratory organs: Secondary | ICD-10-CM | POA: Insufficient documentation

## 2024-06-03 DIAGNOSIS — Z87891 Personal history of nicotine dependence: Secondary | ICD-10-CM | POA: Diagnosis present

## 2024-06-17 ENCOUNTER — Other Ambulatory Visit: Payer: Self-pay | Admitting: Acute Care

## 2024-06-17 DIAGNOSIS — Z87891 Personal history of nicotine dependence: Secondary | ICD-10-CM

## 2024-06-17 DIAGNOSIS — F17210 Nicotine dependence, cigarettes, uncomplicated: Secondary | ICD-10-CM

## 2024-06-17 DIAGNOSIS — Z122 Encounter for screening for malignant neoplasm of respiratory organs: Secondary | ICD-10-CM

## 2024-08-12 ENCOUNTER — Telehealth: Payer: Self-pay | Admitting: Cardiovascular Disease

## 2024-08-12 NOTE — Telephone Encounter (Signed)
Let voicemail

## 2024-08-12 NOTE — Telephone Encounter (Signed)
-----   Message from CMA Marina  L sent at 08/09/2024  2:20 PM EDT ----- Pt has f/u as new pt 8/29 with [facility]. Pt recently seen by [facility] 08/02/2024 and told to follow up in 12 months. Please advise if pt will be switching care to [facility] health care or continuing with [facility].  Thank you, Marina , CMA

## 2024-08-16 ENCOUNTER — Ambulatory Visit: Admitting: Cardiovascular Disease

## 2024-08-28 ENCOUNTER — Encounter: Payer: Self-pay | Admitting: *Deleted

## 2024-08-29 ENCOUNTER — Encounter: Payer: Self-pay | Admitting: *Deleted

## 2024-09-13 ENCOUNTER — Other Ambulatory Visit: Payer: Self-pay

## 2024-09-20 ENCOUNTER — Ambulatory Visit: Admitting: Anesthesiology

## 2024-09-20 ENCOUNTER — Other Ambulatory Visit: Payer: Self-pay

## 2024-09-20 ENCOUNTER — Ambulatory Visit
Admission: RE | Admit: 2024-09-20 | Discharge: 2024-09-20 | Disposition: A | Discharge status: Home / Self Care | Attending: Gastroenterology | Admitting: Gastroenterology

## 2024-09-20 ENCOUNTER — Encounter: Admission: RE | Disposition: A | Payer: Self-pay | Source: Home / Self Care | Attending: Gastroenterology

## 2024-09-20 DIAGNOSIS — F17200 Nicotine dependence, unspecified, uncomplicated: Secondary | ICD-10-CM | POA: Diagnosis not present

## 2024-09-20 DIAGNOSIS — K50812 Crohn's disease of both small and large intestine with intestinal obstruction: Secondary | ICD-10-CM | POA: Insufficient documentation

## 2024-09-20 DIAGNOSIS — K219 Gastro-esophageal reflux disease without esophagitis: Secondary | ICD-10-CM | POA: Insufficient documentation

## 2024-09-20 DIAGNOSIS — K640 First degree hemorrhoids: Secondary | ICD-10-CM | POA: Insufficient documentation

## 2024-09-20 DIAGNOSIS — J449 Chronic obstructive pulmonary disease, unspecified: Secondary | ICD-10-CM | POA: Insufficient documentation

## 2024-09-20 DIAGNOSIS — Z980 Intestinal bypass and anastomosis status: Secondary | ICD-10-CM | POA: Diagnosis not present

## 2024-09-20 HISTORY — PX: COLONOSCOPY: SHX5424

## 2024-09-20 SURGERY — COLONOSCOPY
Anesthesia: General

## 2024-09-20 MED ORDER — PROPOFOL 10 MG/ML IV BOLUS
INTRAVENOUS | Status: DC | PRN
  Administered 2024-09-20: 20 mg via INTRAVENOUS
  Administered 2024-09-20 (×2): 40 mg via INTRAVENOUS

## 2024-09-20 MED ORDER — DEXMEDETOMIDINE HCL IN NACL 80 MCG/20ML IV SOLN
INTRAVENOUS | Status: DC | PRN
  Administered 2024-09-20: 12 ug via INTRAVENOUS
  Administered 2024-09-20: 8 ug via INTRAVENOUS

## 2024-09-20 MED ORDER — PROPOFOL 500 MG/50ML IV EMUL
INTRAVENOUS | Status: DC | PRN
  Administered 2024-09-20: 75 ug/kg/min via INTRAVENOUS

## 2024-09-20 MED ORDER — SODIUM CHLORIDE 0.9 % IV SOLN: INTRAVENOUS | Status: DC

## 2024-09-20 MED ORDER — LIDOCAINE HCL (CARDIAC) PF 100 MG/5ML IV SOSY
PREFILLED_SYRINGE | INTRAVENOUS | Status: DC | PRN
  Administered 2024-09-20: 60 mg via INTRAVENOUS

## 2024-09-20 NOTE — H&P (Signed)
 Outpatient short stay form Pre-procedure 09/20/2024  Rebecca ONEIDA Schick, MD  Primary Physician: Ricard Tawni KIDD, MD  Reason for visit:  Crohns disease assessment  History of present illness:    63 y/o lady with history of small and large bowel crohns disease s/p two surgeries who is currently on skyrizi injections. Last colonoscopy last year with active disease. No blood thinners. Brother just diagnosed with colon cancer.    Current Facility-Administered Medications:    0.9 %  sodium chloride  infusion, , Intravenous, Continuous, Saheed Carrington, Rebecca ONEIDA, MD, Last Rate: 20 mL/hr at 09/20/24 0747, Continued from Pre-op at 09/20/24 0747  Medications Prior to Admission  Medication Sig Dispense Refill Last Dose/Taking   albuterol  (PROVENTIL  HFA;VENTOLIN  HFA) 108 (90 Base) MCG/ACT inhaler Inhale 2 puffs into the lungs every 6 (six) hours as needed for wheezing or shortness of breath. 1 Inhaler 0 09/19/2024   atorvastatin (LIPITOR) 10 MG tablet Take 10 mg by mouth daily.   09/19/2024   FLOVENT HFA 110 MCG/ACT inhaler Inhale 2 puffs into the lungs 2 (two) times daily.   09/19/2024   valACYclovir (VALTREX) 1000 MG tablet Take 1,000 mg by mouth daily.   09/19/2024   Vitamin D, Ergocalciferol, (DRISDOL) 1.25 MG (50000 UT) CAPS capsule Take by mouth.   Past Week   calcium -vitamin D (OSCAL WITH D) 500-5 MG-MCG tablet Take 2 tablets by mouth daily. 180 tablet 3    Certolizumab Pegol 2 X 200 MG/ML KIT Inject 400 mg into the skin every 30 (thirty) days.      CIMZIA PREFILLED 2 X 200 MG/ML PSKT Inject into the skin.      fluticasone-salmeterol (ADVAIR) 250-50 MCG/ACT AEPB Inhale 1 puff into the lungs in the morning and at bedtime.      Multiple Vitamin (MULTI-VITAMINS) TABS Take by mouth.      omeprazole (PRILOSEC) 20 MG capsule Take 20 mg by mouth daily.        Allergies  Allergen Reactions   Nsaids     Due to Crohns disease     Past Medical History:  Diagnosis Date   Anemia    Anxiety     Cervical cancer (HCC)    COPD (chronic obstructive pulmonary disease) (HCC)    Pt states she has some copd   Crohn's disease (HCC)    GERD (gastroesophageal reflux disease)    Squamous cell carcinoma of skin 06/10/2021   right proximal pretibial, treated with EDC   Tuberculosis    treated    Review of systems:  Otherwise negative.    Physical Exam  Gen: Alert, oriented. Appears stated age.  HEENT: PERRLA. Lungs: No respiratory distress CV: RRR Abd: soft, benign, no masses Ext: No edema    Planned procedures: Proceed with colonoscopy. The patient understands the nature of the planned procedure, indications, risks, alternatives and potential complications including but not limited to bleeding, infection, perforation, damage to internal organs and possible oversedation/side effects from anesthesia. The patient agrees and gives consent to proceed.  Please refer to procedure notes for findings, recommendations and patient disposition/instructions.     Rebecca ONEIDA Schick, MD [facility]

## 2024-09-20 NOTE — Interval H&P Note (Signed)
 History and Physical Interval Note:  09/20/2024 8:14 AM  Rebecca Escobar  has presented today for surgery, with the diagnosis of CROHNS.  The various methods of treatment have been discussed with the patient and family. After consideration of risks, benefits and other options for treatment, the patient has consented to  Procedure(s): COLONOSCOPY (N/A) as a surgical intervention.  The patient's history has been reviewed, patient examined, no change in status, stable for surgery.  I have reviewed the patient's chart and labs.  Questions were answered to the patient's satisfaction.     Rebecca Escobar  Ok to proceed with colonoscopy

## 2024-09-20 NOTE — Op Note (Signed)
 [facility]astroenterology Patient Name: Rebecca Escobar Procedure Date: 09/20/2024 8:13 AM MRN: [id number] Account #: [id number] Date of Birth: 1961/02/18 Admit Type: Outpatient Age: 63 Room: [facility] ENDO ROOM 3 Gender: Female Note Status: Finalized Instrument Name: Colon Scope [id number] Procedure:             Colonoscopy Indications:           Crohn's disease of the small bowel and colon Providers:             Ole Schick MD, MD Referring MD:          No Local Md, MD (Referring MD) Medicines:             Monitored Anesthesia Care Complications:         No immediate complications. Estimated blood loss:                         Minimal. Procedure:             Pre-Anesthesia Assessment:                        - Prior to the procedure, a History and Physical was                         performed, and patient medications and allergies were                         reviewed. The patient is competent. The risks and                         benefits of the procedure and the sedation options and                         risks were discussed with the patient. All questions                         were answered and informed consent was obtained.                         Patient identification and proposed procedure were                         verified by the physician, the nurse, the                         anesthesiologist, the anesthetist and the technician                         in the endoscopy suite. Mental Status Examination:                         alert and oriented. Airway Examination: normal                         oropharyngeal airway and neck mobility. Respiratory                         Examination: clear to auscultation. CV Examination:  normal. Prophylactic Antibiotics: The patient does not                         require prophylactic antibiotics. Prior                         Anticoagulants: The patient has taken no anticoagulant                          or antiplatelet agents. ASA Grade Assessment: III - A                         patient with severe systemic disease. After reviewing                         the risks and benefits, the patient was deemed in                         satisfactory condition to undergo the procedure. The                         anesthesia plan was to use monitored anesthesia care                         (MAC). Immediately prior to administration of                         medications, the patient was re-assessed for adequacy                         to receive sedatives. The heart rate, respiratory                         rate, oxygen saturations, blood pressure, adequacy of                         pulmonary ventilation, and response to care were                         monitored throughout the procedure. The physical                         status of the patient was re-assessed after the                         procedure.                        After obtaining informed consent, the colonoscope was                         passed under direct vision. Throughout the procedure,                         the patient's blood pressure, pulse, and oxygen                         saturations were monitored continuously. The was  introduced through the anus and advanced to the the                         ileocolonic anastomosis. The colonoscopy was somewhat                         difficult due to a redundant colon. The patient                         tolerated the procedure well. The quality of the bowel                         preparation was good. The rectum was photographed. Findings:      The perianal and digital rectal examinations were normal.      There was evidence of an end-to-side ileo-colonic anastomosis found in       the terminal ileum. This was characterized by moderate stenosis and       ulceration. This could not be traversed.      The colon (entire examined portion)  appeared normal. Biopsies were taken       with a cold forceps for histology. Estimated blood loss was minimal.      Internal hemorrhoids were found during retroflexion. The hemorrhoids       were Grade I (internal hemorrhoids that do not prolapse).      The exam was otherwise without abnormality on direct and retroflexion       views. Impression:            - End-to-side ileo-colonic anastomosis, characterized                         by ulceration and moderate stenosis.                        - The entire examined colon is normal. Biopsied.                        - Internal hemorrhoids.                        - The examination was otherwise normal on direct and                         retroflexion views. Recommendation:        - Discharge patient to home.                        - Resume previous diet.                        - Continue present medications.                        - Await pathology results.                        - Perform a computed tomographic (CT scan)                         enterography.                        -  Return to referring physician as previously                         scheduled. Procedure Code(s):     --- Professional ---                        [phone], Colonoscopy, flexible; with biopsy, single or                         multiple Diagnosis Code(s):     --- Professional ---                        Z98.0, Intestinal bypass and anastomosis status                        K64.0, First degree hemorrhoids                        K50.80, Crohn's disease of both small and large                         intestine without complications CPT copyright 2022 American Medical Association. All rights reserved. The codes documented in this report are preliminary and upon coder review may  be revised to meet current compliance requirements. Ole Schick MD, MD 09/20/2024 8:47:40 AM Number of Addenda: 0 Note Initiated On: 09/20/2024 8:13 AM Scope Withdrawal Time: 0 hours 10  minutes 49 seconds  Total Procedure Duration: 0 hours 22 minutes 9 seconds  Estimated Blood Loss:  Estimated blood loss was minimal.      [facility]

## 2024-09-20 NOTE — Anesthesia Preprocedure Evaluation (Signed)
 Anesthesia Evaluation  Patient identified by MRN, date of birth, ID band Patient awake    Reviewed: Allergy & Precautions, NPO status , Patient's Chart, lab work & pertinent test results  Airway Mallampati: III  TM Distance: >3 FB Neck ROM: full    Dental  (+) Upper Dentures, Missing   Pulmonary neg pulmonary ROS, COPD,  COPD inhaler, Current SmokerPatient did not abstain from smoking.   Pulmonary exam normal  + decreased breath sounds      Cardiovascular Exercise Tolerance: Good negative cardio ROS Normal cardiovascular exam Rhythm:Regular Rate:Normal     Neuro/Psych   Anxiety     negative neurological ROS  negative psych ROS   GI/Hepatic negative GI ROS, Neg liver ROS,GERD  Medicated,,  Endo/Other  negative endocrine ROS    Renal/GU negative Renal ROS  negative genitourinary   Musculoskeletal   Abdominal   Peds negative pediatric ROS (+)  Hematology negative hematology ROS (+) Blood dyscrasia, anemia   Anesthesia Other Findings Past Medical History: No date: Anemia No date: Anxiety No date: Cervical cancer (HCC) No date: COPD (chronic obstructive pulmonary disease) (HCC)     Comment:  Pt states she has some copd No date: Crohn's disease (HCC) No date: GERD (gastroesophageal reflux disease) 06/10/2021: Squamous cell carcinoma of skin     Comment:  right proximal pretibial, treated with EDC No date: Tuberculosis     Comment:  treated  Past Surgical History: No date: ABDOMINAL HYSTERECTOMY No date: CESAREAN SECTION No date: COLON SURGERY 03/02/2016: COLONOSCOPY WITH PROPOFOL ; N/A     Comment:  Procedure: COLONOSCOPY WITH PROPOFOL ;  Surgeon: Lamar ONEIDA Holmes, MD;  Location: [facility] ENDOSCOPY;  Service:               Endoscopy;  Laterality: N/A; 11/20/2019: COLONOSCOPY WITH PROPOFOL ; N/A     Comment:  Procedure: COLONOSCOPY WITH PROPOFOL ;  Surgeon: [place],               Ladell POUR, MD;   Location: [facility] ENDOSCOPY;  Service:               Gastroenterology;  Laterality: N/A; 07/09/2021: COLONOSCOPY WITH PROPOFOL ; N/A     Comment:  Procedure: COLONOSCOPY WITH PROPOFOL ;  Surgeon:               Maryruth Ole ONEIDA, MD;  Location: [facility] ENDOSCOPY;                Service: Endoscopy;  Laterality: N/A; 11/25/2022: COLONOSCOPY WITH PROPOFOL ; N/A     Comment:  Procedure: COLONOSCOPY WITH PROPOFOL ;  Surgeon:               Maryruth Ole ONEIDA, MD;  Location: [facility] ENDOSCOPY;                Service: Endoscopy;  Laterality: N/A; 10/13/2023: COLONOSCOPY WITH PROPOFOL ; N/A     Comment:  Procedure: COLONOSCOPY WITH PROPOFOL ;  Surgeon:               Maryruth Ole ONEIDA, MD;  Location: [facility] ENDOSCOPY;                Service: Endoscopy;  Laterality: N/A; No date: DILATION AND CURETTAGE OF UTERUS 11/20/2019: ESOPHAGOGASTRODUODENOSCOPY (EGD) WITH PROPOFOL ; N/A     Comment:  Procedure: ESOPHAGOGASTRODUODENOSCOPY (EGD) WITH               PROPOFOL ;  Surgeon: Aundria, Teodoro K, MD;  Location:  [facility] ENDOSCOPY;  Service: Gastroenterology;  Laterality:               N/A; 11/25/2022: ESOPHAGOGASTRODUODENOSCOPY (EGD) WITH PROPOFOL ; N/A     Comment:  Procedure: ESOPHAGOGASTRODUODENOSCOPY (EGD) WITH               PROPOFOL ;  Surgeon: Maryruth Ole DASEN, MD;  Location:               [facility] ENDOSCOPY;  Service: Endoscopy;  Laterality: N/A;  BMI    Body Mass Index: 26.41 kg/m      Reproductive/Obstetrics negative OB ROS                              Anesthesia Physical Anesthesia Plan  ASA: 3  Anesthesia Plan: General   Post-op Pain Management:    Induction: Intravenous  PONV Risk Score and Plan: Propofol  infusion and TIVA  Airway Management Planned: Natural Airway and Nasal Cannula  Additional Equipment:   Intra-op Plan:   Post-operative Plan:   Informed Consent: I have reviewed the patients History and Physical, chart, labs and discussed the procedure  including the risks, benefits and alternatives for the proposed anesthesia with the patient or authorized representative who has indicated his/her understanding and acceptance.     Dental Advisory Given  Plan Discussed with: CRNA  Anesthesia Plan Comments:         Anesthesia Quick Evaluation

## 2024-09-20 NOTE — Transfer of Care (Signed)
 Immediate Anesthesia Transfer of Care Note  Patient: Rebecca Escobar  Procedure(s) Performed: COLONOSCOPY  Patient Location: PACU  Anesthesia Type:General  Level of Consciousness: sedated  Airway & Oxygen Therapy: Patient Spontanous Breathing  Post-op Assessment: Report given to RN and Post -op Vital signs reviewed and stable  Post vital signs: Reviewed and stable  Last Vitals:  Vitals Value Taken Time  BP    Temp    Pulse    Resp    SpO2      Last Pain:  Vitals:   09/20/24 0844  TempSrc: Temporal  PainSc: Asleep         Complications: No notable events documented.

## 2024-09-20 NOTE — Anesthesia Postprocedure Evaluation (Signed)
 Anesthesia Post Note  Patient: Rebecca Escobar  Procedure(s) Performed: COLONOSCOPY  Patient location during evaluation: PACU Anesthesia Type: General Level of consciousness: awake Pain management: satisfactory to patient Vital Signs Assessment: post-procedure vital signs reviewed and stable Respiratory status: spontaneous breathing Cardiovascular status: stable Anesthetic complications: no   No notable events documented.   Last Vitals:  Vitals:   09/20/24 0854 09/20/24 0904  BP: (!) 93/56 103/65  Pulse: (!) 57 (!) 59  Resp: 17 19  Temp:  (!) 35.6 C  SpO2: 97% 97%    Last Pain:  Vitals:   09/20/24 0904  TempSrc: Temporal  PainSc: 0-No pain                 VAN STAVEREN,Katherene Dinino

## 2024-09-23 ENCOUNTER — Other Ambulatory Visit: Payer: Self-pay | Admitting: Family Medicine

## 2024-09-23 DIAGNOSIS — Z1231 Encounter for screening mammogram for malignant neoplasm of breast: Secondary | ICD-10-CM

## 2024-09-23 LAB — SURGICAL PATHOLOGY

## 2024-11-01 ENCOUNTER — Ambulatory Visit
Admission: RE | Admit: 2024-11-01 | Discharge: 2024-11-01 | Disposition: A | Discharge status: Home / Self Care | Source: Ambulatory Visit | Attending: Family Medicine | Admitting: Family Medicine

## 2024-11-01 DIAGNOSIS — Z1231 Encounter for screening mammogram for malignant neoplasm of breast: Secondary | ICD-10-CM | POA: Insufficient documentation

## 2025-08-20 ENCOUNTER — Ambulatory Visit: Payer: Medicare Other | Admitting: Dermatology
